# Patient Record
Sex: Male | Born: 1945
Health system: Southern US, Community
[De-identification: ages and names within clinical notes are randomized; demographics above are authoritative.]

## PROBLEM LIST (undated history)

## (undated) DIAGNOSIS — Z8719 Personal history of other diseases of the digestive system: Secondary | ICD-10-CM

## (undated) DIAGNOSIS — H832X1 Labyrinthine dysfunction, right ear: Secondary | ICD-10-CM

## (undated) DIAGNOSIS — S62173A Displaced fracture of trapezium [larger multangular], unspecified wrist, initial encounter for closed fracture: Secondary | ICD-10-CM

## (undated) DIAGNOSIS — Z8709 Personal history of other diseases of the respiratory system: Secondary | ICD-10-CM

## (undated) DIAGNOSIS — M799 Soft tissue disorder, unspecified: Secondary | ICD-10-CM

## (undated) DIAGNOSIS — Z86018 Personal history of other benign neoplasm: Secondary | ICD-10-CM

## (undated) DIAGNOSIS — Z87820 Personal history of traumatic brain injury: Secondary | ICD-10-CM

## (undated) DIAGNOSIS — H905 Unspecified sensorineural hearing loss: Secondary | ICD-10-CM

## (undated) DIAGNOSIS — J3489 Other specified disorders of nose and nasal sinuses: Secondary | ICD-10-CM

## (undated) DIAGNOSIS — H532 Diplopia: Secondary | ICD-10-CM

## (undated) DIAGNOSIS — S0450XA Injury of facial nerve, unspecified side, initial encounter: Secondary | ICD-10-CM

## (undated) DIAGNOSIS — E785 Hyperlipidemia, unspecified: Secondary | ICD-10-CM

## (undated) DIAGNOSIS — M5136 Other intervertebral disc degeneration, lumbar region: Secondary | ICD-10-CM

## (undated) DIAGNOSIS — Z87442 Personal history of urinary calculi: Secondary | ICD-10-CM

## (undated) DIAGNOSIS — R1310 Dysphagia, unspecified: Secondary | ICD-10-CM

## (undated) DIAGNOSIS — N486 Induration penis plastica: Secondary | ICD-10-CM

## (undated) DIAGNOSIS — M51369 Other intervertebral disc degeneration, lumbar region without mention of lumbar back pain or lower extremity pain: Secondary | ICD-10-CM

## (undated) DIAGNOSIS — K219 Gastro-esophageal reflux disease without esophagitis: Secondary | ICD-10-CM

## (undated) DIAGNOSIS — H8191 Unspecified disorder of vestibular function, right ear: Secondary | ICD-10-CM

## (undated) DIAGNOSIS — Z85828 Personal history of other malignant neoplasm of skin: Secondary | ICD-10-CM

## (undated) DIAGNOSIS — R972 Elevated prostate specific antigen [PSA]: Secondary | ICD-10-CM

## (undated) HISTORY — DX: Displaced fracture of trapezium (larger multangular), unspecified wrist, initial encounter for closed fracture: S62.173A

## (undated) HISTORY — DX: Elevated prostate specific antigen (PSA): R97.20

## (undated) HISTORY — DX: Induration penis plastica: N48.6

## (undated) HISTORY — DX: Dysphagia, unspecified: R13.10

## (undated) HISTORY — PX: ACOUSTIC NEUROMA RESECTION: SHX5713

## (undated) HISTORY — DX: Hyperlipidemia, unspecified: E78.5

---
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
## 1973-08-04 HISTORY — PX: NASAL SEPTUM SURGERY: SHX37

## 1979-07-05 HISTORY — PX: NASAL SINUS SURGERY: SHX719

## 1982-07-04 HISTORY — PX: OTHER SURGICAL HISTORY: SHX169

## 1982-07-04 HISTORY — PX: VEIN LIGATION AND STRIPPING: SHX2653

## 1992-03-24 DIAGNOSIS — D333 Benign neoplasm of cranial nerves: Secondary | ICD-10-CM | POA: Insufficient documentation

## 1992-03-25 DIAGNOSIS — H532 Diplopia: Secondary | ICD-10-CM

## 1992-03-25 HISTORY — DX: Diplopia: H53.2

## 1992-03-25 HISTORY — PX: CRANIECTOMY FOR EXCISION OF ACOUSTIC NEUROMA: SUR324

## 1999-07-05 DIAGNOSIS — N486 Induration penis plastica: Secondary | ICD-10-CM

## 1999-07-05 HISTORY — PX: OTHER SURGICAL HISTORY: SHX169

## 1999-07-05 HISTORY — DX: Induration penis plastica: N48.6

## 2006-02-23 ENCOUNTER — Ambulatory Visit: Payer: Self-pay | Admitting: Gastroenterology

## 2006-04-18 ENCOUNTER — Encounter: Admission: RE | Admit: 2006-04-18 | Discharge: 2006-07-17 | Payer: Self-pay | Admitting: Unknown Physician Specialty

## 2007-01-23 ENCOUNTER — Ambulatory Visit: Payer: Self-pay | Admitting: Urology

## 2007-02-27 ENCOUNTER — Ambulatory Visit: Payer: Self-pay | Admitting: Urology

## 2007-04-22 ENCOUNTER — Ambulatory Visit: Payer: Self-pay | Admitting: Internal Medicine

## 2007-04-30 ENCOUNTER — Ambulatory Visit: Payer: Self-pay | Admitting: Urology

## 2007-05-09 ENCOUNTER — Ambulatory Visit: Payer: Self-pay | Admitting: Urology

## 2008-03-12 ENCOUNTER — Ambulatory Visit: Payer: Self-pay | Admitting: Internal Medicine

## 2008-07-04 HISTORY — PX: INCONTINENCE SURGERY: SHX676

## 2010-07-03 ENCOUNTER — Ambulatory Visit: Payer: Self-pay | Admitting: Internal Medicine

## 2011-07-05 HISTORY — PX: COLONOSCOPY WITH PROPOFOL: SHX5780

## 2011-07-19 DIAGNOSIS — R972 Elevated prostate specific antigen [PSA]: Secondary | ICD-10-CM | POA: Diagnosis not present

## 2011-11-15 DIAGNOSIS — I10 Essential (primary) hypertension: Secondary | ICD-10-CM | POA: Diagnosis not present

## 2012-04-03 DIAGNOSIS — H16219 Exposure keratoconjunctivitis, unspecified eye: Secondary | ICD-10-CM | POA: Diagnosis not present

## 2012-04-24 DIAGNOSIS — R109 Unspecified abdominal pain: Secondary | ICD-10-CM | POA: Diagnosis not present

## 2012-05-24 DIAGNOSIS — K5730 Diverticulosis of large intestine without perforation or abscess without bleeding: Secondary | ICD-10-CM | POA: Diagnosis not present

## 2012-05-24 DIAGNOSIS — K2970 Gastritis, unspecified, without bleeding: Secondary | ICD-10-CM | POA: Diagnosis not present

## 2012-05-24 DIAGNOSIS — D126 Benign neoplasm of colon, unspecified: Secondary | ICD-10-CM | POA: Diagnosis not present

## 2012-05-24 DIAGNOSIS — K2990 Gastroduodenitis, unspecified, without bleeding: Secondary | ICD-10-CM | POA: Diagnosis not present

## 2012-05-24 DIAGNOSIS — R1013 Epigastric pain: Secondary | ICD-10-CM | POA: Diagnosis not present

## 2012-05-24 DIAGNOSIS — Z1211 Encounter for screening for malignant neoplasm of colon: Secondary | ICD-10-CM | POA: Diagnosis not present

## 2012-05-24 DIAGNOSIS — K3189 Other diseases of stomach and duodenum: Secondary | ICD-10-CM | POA: Diagnosis not present

## 2012-06-19 DIAGNOSIS — K589 Irritable bowel syndrome without diarrhea: Secondary | ICD-10-CM | POA: Diagnosis not present

## 2012-08-21 DIAGNOSIS — K589 Irritable bowel syndrome without diarrhea: Secondary | ICD-10-CM | POA: Diagnosis not present

## 2012-09-20 ENCOUNTER — Ambulatory Visit: Payer: Self-pay

## 2012-09-20 DIAGNOSIS — J329 Chronic sinusitis, unspecified: Secondary | ICD-10-CM | POA: Diagnosis not present

## 2012-10-03 DIAGNOSIS — N529 Male erectile dysfunction, unspecified: Secondary | ICD-10-CM | POA: Diagnosis not present

## 2012-10-03 DIAGNOSIS — R972 Elevated prostate specific antigen [PSA]: Secondary | ICD-10-CM | POA: Diagnosis not present

## 2012-11-15 DIAGNOSIS — R972 Elevated prostate specific antigen [PSA]: Secondary | ICD-10-CM | POA: Diagnosis not present

## 2013-03-07 DIAGNOSIS — N529 Male erectile dysfunction, unspecified: Secondary | ICD-10-CM | POA: Diagnosis not present

## 2013-03-07 DIAGNOSIS — R361 Hematospermia: Secondary | ICD-10-CM | POA: Diagnosis not present

## 2013-03-07 DIAGNOSIS — N401 Enlarged prostate with lower urinary tract symptoms: Secondary | ICD-10-CM | POA: Diagnosis not present

## 2013-03-07 DIAGNOSIS — N138 Other obstructive and reflux uropathy: Secondary | ICD-10-CM | POA: Diagnosis not present

## 2013-03-17 ENCOUNTER — Ambulatory Visit: Payer: Self-pay

## 2013-03-17 DIAGNOSIS — H9190 Unspecified hearing loss, unspecified ear: Secondary | ICD-10-CM | POA: Diagnosis not present

## 2013-03-17 DIAGNOSIS — R3919 Other difficulties with micturition: Secondary | ICD-10-CM | POA: Diagnosis not present

## 2013-03-17 DIAGNOSIS — N400 Enlarged prostate without lower urinary tract symptoms: Secondary | ICD-10-CM | POA: Diagnosis not present

## 2013-03-17 LAB — URINALYSIS, COMPLETE
Bacteria: NEGATIVE
Blood: NEGATIVE
Leukocyte Esterase: NEGATIVE
Nitrite: NEGATIVE
Ph: 6 (ref 4.5–8.0)
Specific Gravity: >=1.03 (ref 1.003–1.030)
Squamous Epithelial: NONE SEEN
WBC UR: NONE SEEN /HPF (ref 0–5)

## 2013-03-25 DIAGNOSIS — IMO0002 Reserved for concepts with insufficient information to code with codable children: Secondary | ICD-10-CM | POA: Diagnosis not present

## 2013-03-25 DIAGNOSIS — S838X9A Sprain of other specified parts of unspecified knee, initial encounter: Secondary | ICD-10-CM | POA: Diagnosis not present

## 2013-03-31 ENCOUNTER — Emergency Department: Payer: Self-pay | Admitting: Emergency Medicine

## 2013-03-31 DIAGNOSIS — R319 Hematuria, unspecified: Secondary | ICD-10-CM | POA: Diagnosis not present

## 2013-03-31 DIAGNOSIS — N419 Inflammatory disease of prostate, unspecified: Secondary | ICD-10-CM | POA: Diagnosis not present

## 2013-03-31 DIAGNOSIS — N410 Acute prostatitis: Secondary | ICD-10-CM | POA: Diagnosis not present

## 2013-03-31 DIAGNOSIS — R109 Unspecified abdominal pain: Secondary | ICD-10-CM | POA: Diagnosis not present

## 2013-03-31 DIAGNOSIS — Z79899 Other long term (current) drug therapy: Secondary | ICD-10-CM | POA: Diagnosis not present

## 2013-03-31 LAB — URINALYSIS, COMPLETE
Bilirubin,UR: NEGATIVE
Nitrite: NEGATIVE
Ph: 5 (ref 4.5–8.0)
Protein: 30
RBC,UR: 26 /HPF (ref 0–5)
Squamous Epithelial: NONE SEEN

## 2013-03-31 LAB — BASIC METABOLIC PANEL
BUN: 16 mg/dL (ref 7–18)
Calcium, Total: 8.8 mg/dL (ref 8.5–10.1)
Chloride: 108 mmol/L — ABNORMAL HIGH (ref 98–107)
Co2: 28 mmol/L (ref 21–32)
Creatinine: 0.99 mg/dL (ref 0.60–1.30)
EGFR (African American): >60
EGFR (Non-African Amer.): >60

## 2013-03-31 LAB — CBC
HCT: 44.7 % (ref 40.0–52.0)
HGB: 15.3 g/dL (ref 13.0–18.0)
MCH: 31.3 pg (ref 26.0–34.0)
MCV: 92 fL (ref 80–100)
RDW: 14.3 % (ref 11.5–14.5)
WBC: 4.4 10*3/uL (ref 3.8–10.6)

## 2013-04-04 DIAGNOSIS — M659 Synovitis and tenosynovitis, unspecified: Secondary | ICD-10-CM | POA: Diagnosis not present

## 2013-04-04 DIAGNOSIS — S83289A Other tear of lateral meniscus, current injury, unspecified knee, initial encounter: Secondary | ICD-10-CM | POA: Diagnosis not present

## 2013-04-04 DIAGNOSIS — IMO0002 Reserved for concepts with insufficient information to code with codable children: Secondary | ICD-10-CM | POA: Diagnosis not present

## 2013-04-04 DIAGNOSIS — M2240 Chondromalacia patellae, unspecified knee: Secondary | ICD-10-CM | POA: Diagnosis not present

## 2013-04-09 DIAGNOSIS — N400 Enlarged prostate without lower urinary tract symptoms: Secondary | ICD-10-CM | POA: Insufficient documentation

## 2013-04-09 DIAGNOSIS — N138 Other obstructive and reflux uropathy: Secondary | ICD-10-CM | POA: Diagnosis not present

## 2013-04-09 DIAGNOSIS — N401 Enlarged prostate with lower urinary tract symptoms: Secondary | ICD-10-CM | POA: Insufficient documentation

## 2013-04-09 DIAGNOSIS — R310 Gross hematuria: Secondary | ICD-10-CM | POA: Diagnosis not present

## 2013-04-10 DIAGNOSIS — Z23 Encounter for immunization: Secondary | ICD-10-CM | POA: Diagnosis not present

## 2013-04-15 DIAGNOSIS — N400 Enlarged prostate without lower urinary tract symptoms: Secondary | ICD-10-CM | POA: Diagnosis not present

## 2013-04-15 DIAGNOSIS — R972 Elevated prostate specific antigen [PSA]: Secondary | ICD-10-CM | POA: Diagnosis not present

## 2013-04-15 DIAGNOSIS — R310 Gross hematuria: Secondary | ICD-10-CM | POA: Diagnosis not present

## 2013-04-23 DIAGNOSIS — N400 Enlarged prostate without lower urinary tract symptoms: Secondary | ICD-10-CM | POA: Diagnosis not present

## 2013-04-23 DIAGNOSIS — N281 Cyst of kidney, acquired: Secondary | ICD-10-CM | POA: Diagnosis not present

## 2013-04-23 DIAGNOSIS — R310 Gross hematuria: Secondary | ICD-10-CM | POA: Diagnosis not present

## 2013-04-30 DIAGNOSIS — R310 Gross hematuria: Secondary | ICD-10-CM | POA: Diagnosis not present

## 2013-04-30 DIAGNOSIS — R972 Elevated prostate specific antigen [PSA]: Secondary | ICD-10-CM | POA: Diagnosis not present

## 2013-05-02 ENCOUNTER — Other Ambulatory Visit: Payer: Self-pay | Admitting: Urology

## 2013-05-04 HISTORY — PX: KNEE ARTHROSCOPY W/ MENISCAL REPAIR: SHX1877

## 2013-05-06 ENCOUNTER — Encounter (HOSPITAL_BASED_OUTPATIENT_CLINIC_OR_DEPARTMENT_OTHER): Payer: Self-pay | Admitting: *Deleted

## 2013-05-06 NOTE — Progress Notes (Signed)
NPO AFTER MN.  ARRIVE AT 0930.  NEEDS HG. 

## 2013-05-07 DIAGNOSIS — I10 Essential (primary) hypertension: Secondary | ICD-10-CM | POA: Diagnosis not present

## 2013-05-07 DIAGNOSIS — K439 Ventral hernia without obstruction or gangrene: Secondary | ICD-10-CM | POA: Diagnosis not present

## 2013-05-07 NOTE — H&P (Signed)
History of Present Illness  Gross hematuria: He was seen in the emergency room at [facility] on 03/31/13 for gross hematuria.  He had seen Dr. Lonna Cobb one week previously.  He was found to have a normal creatinine of 0.99,  normal serum calcium of 8.8 and a normal hemoglobin of 15.3.  His urinalysis had red cells but only 3 white blood cells and no bacteria.  He was placed on empiric Cipro however no culture was performed. A CT scan without contrast revealed some perinephric stranding bilaterally of questionable significance but no other abnormality.  He was noted to have prostatic enlargement but no bladder calculi.  BPH with outlet obstructive: He underwent KTP laser ablation of his prostate in 2008. He remains on alpha blockade therapy at this time.  Bladder calculus: He was treated with cystolitholapaxy in 2008 at which time he underwent outlet resistance surgery as well.  Elevated PSA: His PSA got as high as 6.02 in 9/12. 5/14 - 4.0 9/14 - 4.6/33%  Hypogonadism: He was found to have a low testosterone in the past. He was on hormone replacement therapy on time.  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  Peyronie's disease: He had dorsal curvature and underwent a plaque incision and patch graft by Dr. Colon Branch in the past.  Interval history:   Past Medical History Problems  1. History of  Bladder Calculus V13.01 2. History of  Esophageal Reflux 530.81 3. History of  Hearing Loss Right 389.9 4. 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 History of  Peyronie's Disease 607.85 6. History of  Reported A Previous Heart Murmur 7. 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 History of  Colonoscopy (Fiberoptic) 2. History of  Craniotomy Excision Of Acoustic Neuroma Right 3. History of  Cystoscopy With Fragmentation Of Bladder Calculus 4. History of  Endoscopy - Papillotomy 5. History of  Laser Coagulation Of  Prostate 6. History of  Maxillary Sinus Endoscopy (Therapeutic) 7. History of  Nasal Septal Deviation Repair 8. History of  Surgery Penis Excision Of Penile Plaque  W/ Graft To 5 Cm 9. History of  Tonsillectomy 10. History of  Varicose Vein Ligation  Current Meds 1. Aspirin 81 MG Oral Tablet; Therapy: (Recorded:13Oct2014) to 2. Centrum Silver TABS; Therapy: (Recorded:13Oct2014) to 3. Fish Oil CAPS; Therapy: (Recorded:13Oct2014) to 4. Osteo Bi-Flex Joint Shield TABS; Therapy: (Recorded:13Oct2014) to 5. Vitamin D3 1000 UNIT Oral Capsule; Therapy: (Recorded:13Oct2014) to  Allergies Medication  1. Cephalexin CAPS 2. Cephalosporins  Family History Problems  1. Family history of  Death In The Family Father 2. Family history of  Death In The Family Mother 3. Family history of  Family Health Status Children ___ Living Daughters 1 4. Maternal history of  Stroke Syndrome V17.1  Social History Problems  1. Marital History - Currently Married 2. Never A Smoker 3. Retired From Work Denied  4. History of  Alcohol Use 5. History of  Caffeine Use  Review of Systems Genitourinary, constitutional, skin, eye, otolaryngeal, hematologic/lymphatic, cardiovascular, pulmonary, endocrine, musculoskeletal, gastrointestinal, neurological and psychiatric system(s) were reviewed and pertinent findings if present are noted.  Genitourinary: nocturia, weak urinary stream, hematuria and erectile dysfunction.  Gastrointestinal: heartburn.  Eyes: diplopia.  ENT: sinus problems.  Hematologic/Lymphatic: a tendency to easily bruise and swollen glands.  Cardiovascular: leg swelling.  Respiratory: shortness of breath.  Endocrine: polydipsia.  Musculoskeletal: back pain and joint pain.  Neurological: dizziness.  Vitals Vital Signs  BMI Calculated: 27.62 BSA Calculated: 1.91 Height: 5 ft 7 in Weight: 176 lb  Blood Pressure: 131 / 77 Temperature: 97.9 F Heart Rate: 64  Physical  Exam Constitutional: Well nourished and well developed . No acute distress.  ENT:. The ears and nose are normal in appearance.  Neck: The appearance of the neck is normal and no neck mass is present.  Pulmonary: No respiratory distress and normal respiratory rhythm and effort.  Cardiovascular: Heart rate and rhythm are normal . No peripheral edema.  Abdomen: The abdomen is soft and nontender. No masses are palpated. No CVA tenderness. No hernias are palpable. No hepatosplenomegaly noted.  Rectal: Rectal exam demonstrates normal sphincter tone, no tenderness and no masses. The prostate has no nodularity and is not tender. The left seminal vesicle is nonpalpable. The right seminal vesicle is nonpalpable. The perineum is normal on inspection.  Genitourinary: Examination of the penis demonstrates no discharge, no masses, no lesions and a normal meatus. The scrotum is without lesions. The right epididymis is palpably normal and non-tender. The left epididymis is palpably normal and non-tender. The right testis is non-tender and without masses. The left testis is non-tender and without masses.  Lymphatics: The femoral and inguinal nodes are not enlarged or tender.  Skin: Normal skin turgor, no visible rash and no visible skin lesions.  Neuro/Psych:. Mood and affect are appropriate.   AU CT-HEMATURIA PROTOCOL  Ihor Gully   Test Name Result Flag Reference  AU CT-HEMATURIA PROTOCOL (Report)    ** RADIOLOGY REPORT BY [facility] **   CLINICAL DATA: Gross hematuria  EXAM: CT ABDOMEN AND PELVIS WITHOUT AND WITH CONTRAST  TECHNIQUE: Multidetector CT imaging of the abdomen and pelvis was performed without contrast material in one or both body regions, followed by contrast material(s) and further sections in one or both body regions.  CONTRAST: 125 mL Isovue 300 IV  COMPARISON: None.  FINDINGS: Mild dependent atelectasis at the lung bases.  Liver, pancreas, and adrenal glands  are within normal limits.  Mildly heterogeneous spleen with possible small hypoenhancing lesions measuring up to 8 mm (series 3/image 31), although this normalizes on delayed imaging and may simply reflect heterogeneous early perfusion. Regardless, this appearance is likely benign.  Gallbladder is unremarkable. No intrahepatic or extrahepatic ductal dilatation.  Small bilateral renal cysts, including an 8 mm medial right upper pole renal cyst (series 3/image 33) and a 10 mm medial left lower pole renal cyst (series 3/image 46). No enhancing renal lesions. Small bilateral renal sinus cysts. No renal calculi or hydronephrosis.  No evidence of bowel obstruction. Normal appendix. Colonic diverticulosis, without associated inflammatory changes.  No evidence of abdominal aortic aneurysm.  No abdominopelvic ascites.  No suspicious abdominopelvic lymphadenopathy.  Prostatomegaly with enlargement of the central gland which indents the base of the bladder.  No ureteral or bladder calculi.  Thick-walled bladder, possibly reflecting chronic bladder outlet obstruction.  On delayed imaging, there are no filling defects in the bilateral opacified proximal collecting systems, ureters, or bladder.  Tiny fat containing right inguinal hernia.  Mild degenerative changes of the visualized lower thoracic spine.  IMPRESSION: No renal, ureteral, or bladder calculi. No hydronephrosis.  Bilateral renal cysts measuring up to 10 mm in the left lower kidney. No enhancing renal lesions.  No filling defects in the bilateral opacified proximal collecting systems, ureters, or bladder.  Prostatomegaly with enlargement of the central gland which indents the base of the bladder.  Thick-walled bladder, possibly reflecting  chronic bladder outlet obstruction. Cystoscopic correlation is suggested.   Electronically Signed  By: Charline Bills M.D.  On: 04/23/2013 12:23   BUN & CREATININE   Ihor Gully  SPECIMEN TYPE: BLOOD   Test Name Result Flag Reference  CREATININE 0.96 mg/dL  4.09-8.11  BUN 19 mg/dL  9-14  Est GFR, African American >89 mL/min    Est GFR, NonAfrican American 80 mL/min    THE ESTIMATED GFR IS A CALCULATION VALID FOR ADULTS (>=48 YEARS OLD) THAT USES THE CKD-EPI ALGORITHM TO ADJUST FOR AGE AND SEX. IT IS   NOT TO BE USED FOR CHILDREN, PREGNANT WOMEN, HOSPITALIZED PATIENTS,    PATIENTS ON DIALYSIS, OR WITH RAPIDLY CHANGING KIDNEY FUNCTION. ACCORDING TO THE NKDEP, EGFR >89 IS NORMAL, 60-89 SHOWS MILD IMPAIRMENT, 30-59 SHOWS MODERATE IMPAIRMENT, 15-29 SHOWS SEVERE IMPAIRMENT AND <15 IS ESRD.   PSA REFLEX TO FREE  Ihor Gully  SPECIMEN TYPE: BLOOD   Test Name Result Flag Reference  PSA 5.95 ng/mL H <=4.00  TEST METHODOLOGY: ECLIA PSA (ELECTROCHEMILUMINESCENCE IMMUNOASSAY)  PSA, FREE 1.55 ng/mL    PSA, %FREE 26 %  > 25  PROBABILITY OF PROSTATE CANCER   (FOR MEN WITH NON-SUSPICIOUS DRE RESULTS AND PSA BETWEEN 4 AND   10 NG/ML, BY PATIENT AGE)     % FREE PSA                          PATIENT AGE                          50 TO 59 YEARS  60 TO 69 YEARS  >70 YEARS    <=10%                  49.2%           57.5%          64.5%    11 - 18%               26.9%           33.9%          40.8%    19 - 25%               18.3%           23.9%          29.7%    >25%                    9.1%           12.2%          15.8%   Procedure  Procedure: Cystoscopy   Indication: Hematuria.  Informed Consent: Risks, benefits, and potential adverse events were discussed and informed consent was obtained from the patient.  Prep: The patient was prepped with betadine.  Anesthesia:. Local anesthesia was administered intraurethrally with 2% lidocaine jelly.  Procedure Note:  Urethral meatus:. No abnormalities.  Anterior urethra: No abnormalities.  Prostatic urethra: No abnormalities.  Bladder: Visulization was clear. The ureteral orifices were in the normal anatomic  position bilaterally and had clear efflux of urine. Examination of the bladder demonstrated mild trabeculation. There was an area inside the bladder neck at the 11 o'clock position that appeared to possibly be some form of inflammation/edema however transitional cell carcinoma could not be ruled out. The patient tolerated the procedure well.  Complications: None.    Assessment  We went over the results of his workup for hematuria which has revealed a normal creatinine of 0.96.  We discussed his CT scan findings which have revealed no abnormality of the kidneys, ureters or bladder.  He did have simple cysts of the kidneys of no clinical significance and I discussed that with him today.  Cystoscopically I found changes in the prostatic urethra consistent with his previous KTP laser surgery however the apical tissue still appeared to be somewhat obstructing.  I did find an area in the bladder that appeared somewhat papillary although it could be some form of edema are not sure why it would be there at that location and I therefore have recommended further evaluation with a biopsy of this lesion.  I went over the procedure with him in detail including its risks and complications, the probability of success and the outpatient nature of the procedure as well as anticipated postoperative course.  He understands and elected to proceed.      His PSA was found to be elevated at 5.95 last 26%.  While elevated his free to total ratio remains good indicating that this is likely due to benign causes however we discussed biopsy versus observation since it has been higher in the past.  I have currently recommended a repeat DRE and PSA in 6 months.   Plan  He will be scheduled for transurethral resection of the bladder lesion under anesthesia as an outpatient.

## 2013-05-09 ENCOUNTER — Encounter: Payer: Self-pay | Admitting: General Surgery

## 2013-05-10 ENCOUNTER — Encounter (HOSPITAL_BASED_OUTPATIENT_CLINIC_OR_DEPARTMENT_OTHER): Payer: Self-pay | Admitting: Anesthesiology

## 2013-05-10 ENCOUNTER — Encounter (HOSPITAL_BASED_OUTPATIENT_CLINIC_OR_DEPARTMENT_OTHER)
Admission: RE | Disposition: A | Discharge status: Home / Self Care | Payer: Self-pay | Source: Ambulatory Visit | Attending: Urology

## 2013-05-10 ENCOUNTER — Ambulatory Visit (HOSPITAL_BASED_OUTPATIENT_CLINIC_OR_DEPARTMENT_OTHER): Payer: Medicare Other | Admitting: Anesthesiology

## 2013-05-10 ENCOUNTER — Encounter (HOSPITAL_BASED_OUTPATIENT_CLINIC_OR_DEPARTMENT_OTHER): Payer: Medicare Other | Admitting: Anesthesiology

## 2013-05-10 ENCOUNTER — Ambulatory Visit (HOSPITAL_BASED_OUTPATIENT_CLINIC_OR_DEPARTMENT_OTHER)
Admission: RE | Admit: 2013-05-10 | Discharge: 2013-05-10 | Disposition: A | Discharge status: Home / Self Care | Payer: Medicare Other | Source: Ambulatory Visit | Attending: Urology | Admitting: Urology

## 2013-05-10 DIAGNOSIS — N329 Bladder disorder, unspecified: Secondary | ICD-10-CM

## 2013-05-10 DIAGNOSIS — Z7982 Long term (current) use of aspirin: Secondary | ICD-10-CM | POA: Insufficient documentation

## 2013-05-10 DIAGNOSIS — N320 Bladder-neck obstruction: Secondary | ICD-10-CM | POA: Insufficient documentation

## 2013-05-10 DIAGNOSIS — N138 Other obstructive and reflux uropathy: Secondary | ICD-10-CM | POA: Insufficient documentation

## 2013-05-10 DIAGNOSIS — R972 Elevated prostate specific antigen [PSA]: Secondary | ICD-10-CM | POA: Diagnosis not present

## 2013-05-10 DIAGNOSIS — N401 Enlarged prostate with lower urinary tract symptoms: Secondary | ICD-10-CM | POA: Diagnosis not present

## 2013-05-10 DIAGNOSIS — N3080 Other cystitis without hematuria: Secondary | ICD-10-CM | POA: Diagnosis not present

## 2013-05-10 DIAGNOSIS — K219 Gastro-esophageal reflux disease without esophagitis: Secondary | ICD-10-CM | POA: Insufficient documentation

## 2013-05-10 HISTORY — DX: Personal history of traumatic brain injury: Z87.820

## 2013-05-10 HISTORY — DX: Other intervertebral disc degeneration, lumbar region: M51.36

## 2013-05-10 HISTORY — DX: Unspecified disorder of vestibular function, right ear: H81.91

## 2013-05-10 HISTORY — DX: Personal history of other diseases of the respiratory system: Z87.09

## 2013-05-10 HISTORY — DX: Gastro-esophageal reflux disease without esophagitis: K21.9

## 2013-05-10 HISTORY — DX: Diplopia: H53.2

## 2013-05-10 HISTORY — DX: Unspecified sensorineural hearing loss: H90.5

## 2013-05-10 HISTORY — DX: Other intervertebral disc degeneration, lumbar region without mention of lumbar back pain or lower extremity pain: M51.369

## 2013-05-10 HISTORY — PX: CYSTOSCOPY WITH BIOPSY: SHX5122

## 2013-05-10 HISTORY — DX: Personal history of other diseases of the digestive system: Z87.19

## 2013-05-10 HISTORY — DX: Labyrinthine dysfunction, right ear: H83.2X1

## 2013-05-10 LAB — POCT HEMOGLOBIN-HEMACUE: Hemoglobin: 14.5 g/dL (ref 13.0–17.0)

## 2013-05-10 SURGERY — CYSTOSCOPY, WITH BIOPSY
Anesthesia: General | Site: Bladder | Wound class: Clean Contaminated

## 2013-05-10 MED ORDER — PROPOFOL 10 MG/ML IV BOLUS
INTRAVENOUS | Status: DC | PRN
  Administered 2013-05-10: 150 mg via INTRAVENOUS

## 2013-05-10 MED ORDER — ONDANSETRON HCL 4 MG/2ML IJ SOLN
INTRAMUSCULAR | Status: DC | PRN
  Administered 2013-05-10: 4 mg via INTRAVENOUS

## 2013-05-10 MED ORDER — LIDOCAINE HCL (CARDIAC) 20 MG/ML IV SOLN
INTRAVENOUS | Status: DC | PRN
  Administered 2013-05-10: 60 mg via INTRAVENOUS

## 2013-05-10 MED ORDER — PHENAZOPYRIDINE HCL 200 MG PO TABS
200.0000 mg | ORAL_TABLET | Freq: Once | ORAL | Status: AC
  Administered 2013-05-10: 200 mg via ORAL
  Filled 2013-05-10: qty 1

## 2013-05-10 MED ORDER — FENTANYL CITRATE 0.05 MG/ML IJ SOLN
INTRAMUSCULAR | Status: DC | PRN
  Administered 2013-05-10: 25 ug via INTRAVENOUS
  Administered 2013-05-10: 50 ug via INTRAVENOUS
  Administered 2013-05-10: 25 ug via INTRAVENOUS

## 2013-05-10 MED ORDER — CIPROFLOXACIN IN D5W 200 MG/100ML IV SOLN
200.0000 mg | INTRAVENOUS | Status: AC
  Administered 2013-05-10: 200 mg via INTRAVENOUS
  Filled 2013-05-10: qty 100

## 2013-05-10 MED ORDER — FENTANYL CITRATE 0.05 MG/ML IJ SOLN
25.0000 ug | INTRAMUSCULAR | Status: DC | PRN
  Filled 2013-05-10: qty 1

## 2013-05-10 MED ORDER — EPHEDRINE SULFATE 50 MG/ML IJ SOLN
INTRAMUSCULAR | Status: DC | PRN
  Administered 2013-05-10: 10 mg via INTRAVENOUS

## 2013-05-10 MED ORDER — PROMETHAZINE HCL 25 MG/ML IJ SOLN
6.2500 mg | INTRAMUSCULAR | Status: DC | PRN
  Filled 2013-05-10: qty 1

## 2013-05-10 MED ORDER — LACTATED RINGERS IV SOLN
INTRAVENOUS | Status: DC
  Administered 2013-05-10 (×2): via INTRAVENOUS
  Filled 2013-05-10: qty 1000

## 2013-05-10 MED ORDER — PHENAZOPYRIDINE HCL 200 MG PO TABS: 200.0000 mg | ORAL_TABLET | Freq: Three times a day (TID) | ORAL | Status: DC | PRN

## 2013-05-10 MED ORDER — HYDROCODONE-ACETAMINOPHEN 7.5-325 MG PO TABS: 1.0000 | ORAL_TABLET | ORAL | Status: DC | PRN

## 2013-05-10 MED ORDER — SODIUM CHLORIDE 0.9 % IR SOLN
Status: DC | PRN
  Administered 2013-05-10: 6000 mL

## 2013-05-10 SURGICAL SUPPLY — 15 items
BAG DRAIN URO-CYSTO SKYTR STRL (DRAIN) ×2 IMPLANT
CANISTER SUCT LVC 12 LTR MEDI- (MISCELLANEOUS) ×2 IMPLANT
CLOTH BEACON ORANGE TIMEOUT ST (SAFETY) ×2 IMPLANT
DRAPE CAMERA CLOSED 9X96 (DRAPES) ×2 IMPLANT
ELECT LOOP MED HF 24F 12D CBL (CLIP) ×2 IMPLANT
ELECT REM PT RETURN 9FT ADLT (ELECTROSURGICAL) ×2
ELECTRODE REM PT RTRN 9FT ADLT (ELECTROSURGICAL) ×1 IMPLANT
GLOVE BIO SURGEON STRL SZ8 (GLOVE) ×2 IMPLANT
GOWN PREVENTION PLUS LG XLONG (DISPOSABLE) ×2 IMPLANT
GOWN STRL REIN XL XLG (GOWN DISPOSABLE) ×2 IMPLANT
IV NS IRRIG 3000ML ARTHROMATIC (IV SOLUTION) ×4 IMPLANT
NEEDLE HYPO 22GX1.5 SAFETY (NEEDLE) IMPLANT
NS IRRIG 500ML POUR BTL (IV SOLUTION) IMPLANT
PACK CYSTOSCOPY (CUSTOM PROCEDURE TRAY) ×2 IMPLANT
WATER STERILE IRR 3000ML UROMA (IV SOLUTION) ×2 IMPLANT

## 2013-05-10 NOTE — Op Note (Signed)
PATIENT:  Jeffery Vasquez  PRE-OPERATIVE DIAGNOSIS: Suspicious bladder lesion  POST-OPERATIVE DIAGNOSIS: Same  PROCEDURE: Bladder biopsy  SURGEON:  Garnett Farm  INDICATION: Jeffery Vasquez is a 67 year old male who previously underwent KTP laser ablation of his prostate in 2008. He continues to require alpha blockade therapy for outlet obstruction. He recently experienced gross hematuria and evaluation of his upper tract was normal by CT scan. Cystoscopically I found what appeared to be either inflammation or possibly transitional cell carcinoma the bladder located just inside the bladder neck at the 11:00 position. We discussed the need for further evaluation with cystoscopy and biopsy.  ANESTHESIA:  General  EBL:  Minimal  DRAINS: None  LOCAL MEDICATIONS USED:  None  SPECIMEN:  Biopsies from the 6:00 and 11:00 positions of the bladder neck to pathology  Description of procedure: After informed consent the patient was taken to the major or, placed on the table and administered general anesthesia. He was then moved to the dorsal lithotomy position and his genitalia sterilely prepped and draped. An official timeout was then performed.  Rigid cystoscopy was performed first using a 20 French cystoscope and 12 lens urethra was noted be normal down to the sphincter which is intact. The prostatic urethra revealed bilobar hypertrophy. No prostatic lesions were identified. Upon entering the bladder I noted one plus trabeculation. The ureteral orifices were well away from the bladder neck. There was again the area of question at the 11:00 position inside the bladder neck and then also a similar but less concerning area at the 6:00 position at the bladder neck as well.  I removed the cystoscope and inserted the resectoscope sheath with Timberlake obturator and removed the obturator. The 12 lens with resectoscope element was then inserted and I obtained a biopsy of the lesion at the 6:00 position  which, when cut into, appeared to be prostatic tissue. The area at 11:00 was also resected and it appeared as if it may have been inflammatory as well. The area was fulgurated, the bladder was drained and the resectoscope was removed. The patient tolerated procedure well and there were no intraoperative complications.  PLAN OF CARE: Discharge to home after PACU  PATIENT DISPOSITION:  PACU - hemodynamically stable.

## 2013-05-10 NOTE — Anesthesia Procedure Notes (Signed)
Procedure Name: LMA Insertion Date/Time: 05/10/2013 11:05 AM Performed by: Norva Pavlov Pre-anesthesia Checklist: Patient identified, Emergency Drugs available, Suction available and Patient being monitored Patient Re-evaluated:Patient Re-evaluated prior to inductionOxygen Delivery Method: Circle System Utilized Preoxygenation: Pre-oxygenation with 100% oxygen Intubation Type: IV induction Ventilation: Mask ventilation without difficulty LMA: LMA inserted LMA Size: 4.0 Number of attempts: 1 Airway Equipment and Method: bite block Placement Confirmation: positive ETCO2 Tube secured with: Tape Dental Injury: Teeth and Oropharynx as per pre-operative assessment

## 2013-05-10 NOTE — Transfer of Care (Signed)
Immediate Anesthesia Transfer of Care Note  Patient: Jeffery Vasquez  Procedure(s) Performed: Procedure(s) (LRB): CYSTOSCOPY WITH BLADDER BIOPSY (N/A)  Patient Location: PACU  Anesthesia Type: General  Level of Consciousness: awake, alert  and oriented  Airway & Oxygen Therapy: Patient Spontanous Breathing and Patient connected to face mask oxygen  Post-op Assessment: Report given to PACU RN and Post -op Vital signs reviewed and stable  Post vital signs: Reviewed and stable  Complications: No apparent anesthesia complications

## 2013-05-10 NOTE — Anesthesia Preprocedure Evaluation (Addendum)
Anesthesia Evaluation  Patient identified by MRN, date of birth, ID band Patient awake    Reviewed: Allergy & Precautions, H&P , NPO status , Patient's Chart, lab work & pertinent test results  Airway Mallampati: II TM Distance: >3 FB Neck ROM: Full    Dental no notable dental hx.    Pulmonary neg pulmonary ROS,  breath sounds clear to auscultation  Pulmonary exam normal       Cardiovascular negative cardio ROS  Rhythm:Regular Rate:Normal     Neuro/Psych H/O neurofibroma right acoustic nerve negative psych ROS   GI/Hepatic Neg liver ROS, GERD-  ,  Endo/Other  negative endocrine ROS  Renal/GU negative Renal ROS  negative genitourinary   Musculoskeletal negative musculoskeletal ROS (+)   Abdominal   Peds negative pediatric ROS (+)  Hematology negative hematology ROS (+)   Anesthesia Other Findings   Reproductive/Obstetrics negative OB ROS                          Anesthesia Physical Anesthesia Plan  ASA: II  Anesthesia Plan: General   Post-op Pain Management:    Induction: Intravenous  Airway Management Planned: LMA  Additional Equipment:   Intra-op Plan:   Post-operative Plan: Extubation in OR  Informed Consent: I have reviewed the patients History and Physical, chart, labs and discussed the procedure including the risks, benefits and alternatives for the proposed anesthesia with the patient or authorized representative who has indicated his/her understanding and acceptance.   Dental advisory given  Plan Discussed with: CRNA  Anesthesia Plan Comments:         Anesthesia Quick Evaluation

## 2013-05-10 NOTE — Anesthesia Postprocedure Evaluation (Signed)
  Anesthesia Post-op Note  Patient: Jeffery Vasquez  Procedure(s) Performed: Procedure(s) (LRB): CYSTOSCOPY WITH BLADDER BIOPSY (N/A)  Patient Location: PACU  Anesthesia Type: General  Level of Consciousness: awake and alert   Airway and Oxygen Therapy: Patient Spontanous Breathing  Post-op Pain: mild  Post-op Assessment: Post-op Vital signs reviewed, Patient's Cardiovascular Status Stable, Respiratory Function Stable, Patent Airway and No signs of Nausea or vomiting  Last Vitals:  Filed Vitals:   05/10/13 1307  BP: 143/72  Pulse: 59  Temp: 35.8 C  Resp: 16    Post-op Vital Signs: stable   Complications: No apparent anesthesia complications

## 2013-05-10 NOTE — Interval H&P Note (Signed)
History and Physical Interval Note:  05/10/2013 10:53 AM  Heywood Bene  has presented today for surgery, with the diagnosis of BLADDER LESION  The various methods of treatment have been discussed with the patient and family. After consideration of risks, benefits and other options for treatment, the patient has consented to  Procedure(s): CYSTOSCOPY WITH BLADDER BIOPSY (N/A) as a surgical intervention .  The patient's history has been reviewed, patient examined, no change in status, stable for surgery.  I have reviewed the patient's chart and labs.  Questions were answered to the patient's satisfaction.     Garnett Farm

## 2013-05-13 ENCOUNTER — Encounter (HOSPITAL_BASED_OUTPATIENT_CLINIC_OR_DEPARTMENT_OTHER): Payer: Self-pay | Admitting: Urology

## 2013-05-16 ENCOUNTER — Emergency Department (HOSPITAL_COMMUNITY)
Admission: EM | Admit: 2013-05-16 | Discharge: 2013-05-16 | Disposition: A | Discharge status: Home / Self Care | Payer: Medicare Other | Attending: Emergency Medicine | Admitting: Emergency Medicine

## 2013-05-16 ENCOUNTER — Encounter (HOSPITAL_COMMUNITY): Payer: Self-pay | Admitting: Emergency Medicine

## 2013-05-16 DIAGNOSIS — Z87820 Personal history of traumatic brain injury: Secondary | ICD-10-CM | POA: Insufficient documentation

## 2013-05-16 DIAGNOSIS — K429 Umbilical hernia without obstruction or gangrene: Secondary | ICD-10-CM | POA: Insufficient documentation

## 2013-05-16 DIAGNOSIS — Z87448 Personal history of other diseases of urinary system: Secondary | ICD-10-CM | POA: Insufficient documentation

## 2013-05-16 DIAGNOSIS — Z87828 Personal history of other (healed) physical injury and trauma: Secondary | ICD-10-CM | POA: Insufficient documentation

## 2013-05-16 DIAGNOSIS — Z8709 Personal history of other diseases of the respiratory system: Secondary | ICD-10-CM | POA: Insufficient documentation

## 2013-05-16 DIAGNOSIS — Z8739 Personal history of other diseases of the musculoskeletal system and connective tissue: Secondary | ICD-10-CM | POA: Insufficient documentation

## 2013-05-16 DIAGNOSIS — Z8669 Personal history of other diseases of the nervous system and sense organs: Secondary | ICD-10-CM | POA: Diagnosis not present
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
## 2013-05-16 NOTE — ED Notes (Signed)
Pt reports hernia x2 weeks, pain intermittent. States pain severe today, described as "cramping". Hernia palpated above umbilicus. Pt has been referred to specialist but they are on vacation this week.

## 2013-05-17 ENCOUNTER — Encounter (INDEPENDENT_AMBULATORY_CARE_PROVIDER_SITE_OTHER): Payer: Self-pay | Admitting: Surgery

## 2013-05-17 ENCOUNTER — Ambulatory Visit (INDEPENDENT_AMBULATORY_CARE_PROVIDER_SITE_OTHER): Payer: Medicare Other | Admitting: Surgery

## 2013-05-17 VITALS — BP 128/70 | HR 60 | Resp 16 | Ht 67.0 in | Wt 172.4 lb

## 2013-05-17 DIAGNOSIS — K219 Gastro-esophageal reflux disease without esophagitis: Secondary | ICD-10-CM | POA: Insufficient documentation

## 2013-05-17 DIAGNOSIS — Z87898 Personal history of other specified conditions: Secondary | ICD-10-CM

## 2013-05-17 DIAGNOSIS — K439 Ventral hernia without obstruction or gangrene: Secondary | ICD-10-CM | POA: Diagnosis not present

## 2013-05-17 DIAGNOSIS — Z86018 Personal history of other benign neoplasm: Secondary | ICD-10-CM | POA: Insufficient documentation
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
## 2013-05-17 NOTE — Patient Instructions (Signed)
Ventral Hernia A ventral hernia (also called an incisional hernia) is a hernia that occurs at the site of a previous surgical cut (incision) in the abdomen. The abdominal wall spans from your lower chest down to your pelvis. If the abdominal wall is weakened from a surgical incision, a hernia can occur. A hernia is a bulge of bowel or muscle tissue pushing out on the weakened part of the abdominal wall. Ventral hernias can get bigger from straining or lifting. Obese and older people are at higher risk for a ventral hernia. People who develop infections after surgery or require repeat incisions at the same site on the abdomen are also at increased risk. CAUSES  A ventral hernia occurs because of weakness in the abdominal wall at an incision site.  SYMPTOMS  Common symptoms include:  A visible bulge or lump on the abdominal wall.  Pain or tenderness around the lump.  Increased discomfort if you cough or make a sudden movement. If the hernia has blocked part of the intestine, a serious complication can occur (incarcerated or strangulated hernia). This can become a problem that requires emergency surgery because the blood flow to the blocked intestine may be cut off. Symptoms may include:  Feeling sick to your stomach (nauseous).  Throwing up (vomiting).  Stomach swelling (distention) or bloating.  Fever.  Rapid heartbeat. DIAGNOSIS  Your caregiver will take a medical history and perform a physical exam. Various tests may be ordered, such as:  Blood tests.  Urine tests.  Ultrasonography.  X-rays.  Computed tomography (CT). TREATMENT  Watchful waiting may be all that is needed for a smaller hernia that does not cause symptoms. Your caregiver may recommend the use of a supportive belt (truss) that helps to keep the abdominal wall intact. For larger hernias or those that cause pain, surgery to repair the hernia is usually recommended. If a hernia becomes strangulated, emergency surgery  needs to be done right away. HOME CARE INSTRUCTIONS  Avoid putting pressure or strain on the abdominal area.  Avoid heavy lifting.  Use good body positioning for physical tasks. Ask your caregiver about proper body positioning.  Use a supportive belt as directed by your caregiver.  Maintain a healthy weight.  Eat foods that are high in fiber, such as whole grains, fruits, and vegetables. Fiber helps prevent difficult bowel movements (constipation).  Drink enough fluids to keep your urine clear or pale yellow.  Follow up with your caregiver as directed. SEEK MEDICAL CARE IF:   Your hernia seems to be getting larger or more painful. SEEK IMMEDIATE MEDICAL CARE IF:   You have abdominal pain that is sudden and sharp.  Your pain becomes severe.  You have repeated vomiting.  You are sweating a lot.  You notice a rapid heartbeat.  You develop a fever. MAKE SURE YOU:   Understand these instructions.  Will watch your condition.  Will get help right away if you are not doing well or get worse. Document Released: 06/06/2012 Document Reviewed: 06/06/2012 [facility] Patient Information 2014 [place], [place]. Diet for Gastroesophageal Reflux Disease, Adult Reflux (acid reflux) is when acid from your stomach flows up into the esophagus. When acid comes in contact with the esophagus, the acid causes irritation and soreness (inflammation) in the esophagus. When reflux happens often or so severely that it causes damage to the esophagus, it is called gastroesophageal reflux disease (GERD). Nutrition therapy can help ease the discomfort of GERD. FOODS OR DRINKS TO AVOID OR LIMIT  Smoking or  chewing tobacco. Nicotine is one of the most potent stimulants to acid production in the gastrointestinal tract.  Caffeinated and decaffeinated coffee and black tea.  Regular or low-calorie carbonated beverages or energy drinks (caffeine-free carbonated beverages are allowed).   Strong spices, such  as black pepper, white pepper, red pepper, cayenne, curry powder, and chili powder.  Peppermint or spearmint.  Chocolate.  High-fat foods, including meats and fried foods. Extra added fats including oils, butter, salad dressings, and nuts. Limit these to less than 8 tsp per day.  Fruits and vegetables if they are not tolerated, such as citrus fruits or tomatoes.  Alcohol.  Any food that seems to aggravate your condition. If you have questions regarding your diet, call your caregiver or a registered dietitian. OTHER THINGS THAT MAY HELP GERD INCLUDE:   Eating your meals slowly, in a relaxed setting.  Eating 5 to 6 small meals per day instead of 3 large meals.  Eliminating food for a period of time if it causes distress.  Not lying down until 3 hours after eating a meal.  Keeping the head of your bed raised 6 to 9 inches (15 to 23 cm) by using a foam wedge or blocks under the legs of the bed. Lying flat may make symptoms worse.  Being physically active. Weight loss may be helpful in reducing reflux in overweight or obese adults.  Wear loose fitting clothing EXAMPLE MEAL PLAN This meal plan is approximately 2,000 calories based on https://www.bernard.org/ meal planning guidelines. Breakfast   cup cooked oatmeal.  1 cup strawberries.  1 cup low-fat milk.  1 oz almonds. Snack  1 cup cucumber slices.  6 oz yogurt (made from low-fat or fat-free milk). Lunch  2 slice whole-wheat bread.  2 oz sliced [place].  2 tsp mayonnaise.  1 cup blueberries.  1 cup snap peas. Snack  6 whole-wheat crackers.  1 oz string cheese. Dinner   cup brown rice.  1 cup mixed veggies.  1 tsp olive oil.  3 oz grilled fish. Document Released: 06/20/2005 Document Revised: 09/12/2011 Document Reviewed: 05/06/2011 [facility] Patient Information 2014 [place], [place].

## 2013-05-20 ENCOUNTER — Telehealth (INDEPENDENT_AMBULATORY_CARE_PROVIDER_SITE_OTHER): Payer: Self-pay | Admitting: *Deleted

## 2013-05-20 DIAGNOSIS — R310 Gross hematuria: Secondary | ICD-10-CM | POA: Diagnosis not present

## 2013-05-20 DIAGNOSIS — N3080 Other cystitis without hematuria: Secondary | ICD-10-CM | POA: Diagnosis not present

## 2013-05-20 NOTE — Telephone Encounter (Signed)
LMOM for pt to return my call.  I was calling to inform him of his appt for the UGI at [facility] on 11/19 with an arrival time of 8:45am.  Pt is to be NPO after midnight.

## 2013-05-22 ENCOUNTER — Ambulatory Visit
Admission: RE | Admit: 2013-05-22 | Discharge: 2013-05-22 | Disposition: A | Discharge status: Home / Self Care | Payer: Federal, State, Local not specified - PPO | Source: Ambulatory Visit | Attending: Surgery | Admitting: Surgery

## 2013-05-22 DIAGNOSIS — K219 Gastro-esophageal reflux disease without esophagitis: Secondary | ICD-10-CM

## 2013-05-23 ENCOUNTER — Ambulatory Visit: Payer: Self-pay | Admitting: General Surgery

## 2013-05-29 ENCOUNTER — Ambulatory Visit: Payer: Self-pay | Admitting: General Surgery

## 2013-06-05 DIAGNOSIS — R1084 Generalized abdominal pain: Secondary | ICD-10-CM | POA: Diagnosis not present

## 2013-06-05 DIAGNOSIS — R319 Hematuria, unspecified: Secondary | ICD-10-CM | POA: Diagnosis not present

## 2013-06-05 DIAGNOSIS — K458 Other specified abdominal hernia without obstruction or gangrene: Secondary | ICD-10-CM | POA: Diagnosis not present

## 2013-06-05 DIAGNOSIS — Z125 Encounter for screening for malignant neoplasm of prostate: Secondary | ICD-10-CM | POA: Diagnosis not present

## 2013-06-12 ENCOUNTER — Encounter (INDEPENDENT_AMBULATORY_CARE_PROVIDER_SITE_OTHER): Payer: Self-pay | Admitting: Surgery

## 2013-06-12 ENCOUNTER — Ambulatory Visit (INDEPENDENT_AMBULATORY_CARE_PROVIDER_SITE_OTHER): Payer: Medicare Other | Admitting: Surgery

## 2013-06-12 VITALS — BP 124/62 | HR 74 | Resp 18 | Ht 67.0 in | Wt 168.0 lb

## 2013-06-12 DIAGNOSIS — K439 Ventral hernia without obstruction or gangrene: Secondary | ICD-10-CM | POA: Diagnosis not present

## 2013-06-12 NOTE — Patient Instructions (Signed)
Laparoscopic Ventral Hernia Repair Laparoscopic ventral hernia repairis a surgery to fix a ventral hernia. Aventral hernia, also called an incisional hernia, is a bulge of body tissue or intestines that pushes through the front part of the abdomen. This can happen if the connective tissue covering the muscles over the abdomen has a weak spot or is torn because of a surgical cut (incision) from a previous surgery. Laparoscopic ventral hernia repair is often done soon after diagnosis to stop the hernia from getting bigger, becoming uncomfortable, or becoming an emergency. This surgery usually takes about 2 hours, but the time can vary greatly. LET YOUR CAREGIVER KNOW ABOUT:  Any allergies you have.  All medicines you are taking, including steroids, vitamins, herbs, eyedrops, and over-the-counter medicines and creams.  Previous problems you or members of your family have had with the use of anesthetics.  Any blood disorders or bleeding problems you have had.  Past surgeries you have had.  Other health problems you have. RISKS AND COMPLICATIONS  Generally, laparoscopic ventral hernia repair is a safe procedure. However, as with any surgical procedure, complications can occur. Possible complications include:  Bleeding.  Trouble passing urine or having a bowel movement after the operation.  Infection.  Pneumonia.  Blood clots.  Pain in the area of the hernia.  A bulge in the area of the hernia that may be caused by a collection of fluid.  Injury to intestines or other structures in the abdomen.  Return of the hernia after surgery. In some cases, the caregiver may need to stop the laparoscopic procedure and do regular, open surgery. This may be necessary for very difficult hernias, when organs are hard to see, or when bleeding problems occur during surgery. BEFORE THE PROCEDURE   You may need to have blood tests, urine tests, a chest X-ray, or electrocardiography done before the day  of the surgery.  Ask your caregiver about changing or stopping your regular medicines.  You may need to wash with a special type of germ-killing soap.  Do not eat or drink anything for at least 6 hours before the surgery.  Make plans to have someone drive you home after the procedure. PROCEDURE   Small monitors will be put on your body. They are used to check your heart, blood pressure, and oxygen level.  An intravenous (IV) access tube will be put into a vein in your hand or arm. Fluids and medicine will flow directly into your body through the IV tube.  You will be given medicine to make you sleep through the procedure (general anesthetic).  Your abdomen will be cleaned with a special soap to kill any germs on your skin.  Once you are asleep, several small incisions will be made in your abdomen.  The large space in your abdomen will be filled with air so that it expands. This gives the caregiver more room and a better view.  A thin, lighted tube with a tiny camera on the end (laparoscope) is put through a small incision in your abdomen. The camera on the laparoscope sends a picture to a TV screen in the operating room. This gives the caregiver a good view inside the abdomen.  Hollow tubes are put through the other small incisions in your abdomen. The tools needed for the procedure are put through these tubes.  The caregiver puts the tissue or intestines that formed the hernia back in place.  A screen-like patch (mesh) is used to close the hernia. This helps make   the area stronger. Stitches, tacks, or staples are used to keep the mesh in place.  Medicine and a bandage (dressing) or skin glue will be put over the incisions. AFTER THE PROCEDURE   You will stay in a recovery area until the anesthetic wears off. Your blood pressure and pulse will be checked often.  You may be able to go home the same day or may need to stay in the hospital for 1 or 2 days after this surgery. Your  caregiver will decide when you can go home.  You may feel some pain. You will likely be given medicine for pain.  You will be urged to do breathing exercises that involve taking deep breaths. This helps prevent a lung infection after a surgery.  You may have to wear compression stockings while you are in the hospital. These stockings help keep blood clots from forming in your legs. Document Released: 06/06/2012 Document Reviewed: 06/06/2012 [employer] Patient Information 2014 [employer].  
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
## 2013-06-13 ENCOUNTER — Encounter (HOSPITAL_COMMUNITY): Payer: Self-pay | Admitting: Emergency Medicine

## 2013-06-13 ENCOUNTER — Emergency Department (HOSPITAL_COMMUNITY)
Admission: EM | Admit: 2013-06-13 | Discharge: 2013-06-14 | Disposition: A | Discharge status: Home / Self Care | Payer: Medicare Other | Attending: Emergency Medicine | Admitting: Emergency Medicine

## 2013-06-13 ENCOUNTER — Emergency Department (HOSPITAL_COMMUNITY): Payer: Medicare Other

## 2013-06-13 DIAGNOSIS — Z8669 Personal history of other diseases of the nervous system and sense organs: Secondary | ICD-10-CM | POA: Insufficient documentation

## 2013-06-13 DIAGNOSIS — K5732 Diverticulitis of large intestine without perforation or abscess without bleeding: Secondary | ICD-10-CM | POA: Insufficient documentation

## 2013-06-13 DIAGNOSIS — Z8739 Personal history of other diseases of the musculoskeletal system and connective tissue: Secondary | ICD-10-CM | POA: Diagnosis not present

## 2013-06-13 DIAGNOSIS — Z8709 Personal history of other diseases of the respiratory system: Secondary | ICD-10-CM | POA: Diagnosis not present

## 2013-06-13 DIAGNOSIS — Z87828 Personal history of other (healed) physical injury and trauma: Secondary | ICD-10-CM | POA: Insufficient documentation

## 2013-06-13 DIAGNOSIS — N400 Enlarged prostate without lower urinary tract symptoms: Secondary | ICD-10-CM | POA: Diagnosis not present

## 2013-06-13 DIAGNOSIS — K5792 Diverticulitis of intestine, part unspecified, without perforation or abscess without bleeding: Secondary | ICD-10-CM

## 2013-06-13 DIAGNOSIS — R319 Hematuria, unspecified: Secondary | ICD-10-CM | POA: Diagnosis not present

## 2013-06-13 DIAGNOSIS — N390 Urinary tract infection, site not specified: Secondary | ICD-10-CM | POA: Insufficient documentation

## 2013-06-13 DIAGNOSIS — K429 Umbilical hernia without obstruction or gangrene: Secondary | ICD-10-CM | POA: Diagnosis not present

## 2013-06-13 LAB — COMPREHENSIVE METABOLIC PANEL
Alkaline Phosphatase: 56 U/L (ref 39–117)
BUN: 20 mg/dL (ref 6–23)
CO2: 26 mEq/L (ref 19–32)
Calcium: 8.7 mg/dL (ref 8.4–10.5)
GFR calc Af Amer: >90 mL/min (ref >90)
GFR calc non Af Amer: 84 mL/min — ABNORMAL LOW (ref >90)
Glucose, Bld: 122 mg/dL — ABNORMAL HIGH (ref 70–99)
Potassium: 4 mEq/L (ref 3.5–5.1)
Total Protein: 5.9 g/dL — ABNORMAL LOW (ref 6.0–8.3)

## 2013-06-13 LAB — CBC WITH DIFFERENTIAL/PLATELET
Eosinophils Absolute: 0.1 10*3/uL (ref 0.0–0.7)
Eosinophils Relative: 2 % (ref 0–5)
HCT: 36.3 % — ABNORMAL LOW (ref 39.0–52.0)
Hemoglobin: 12.2 g/dL — ABNORMAL LOW (ref 13.0–17.0)
Lymphocytes Relative: 32 % (ref 12–46)
Lymphs Abs: 1.7 10*3/uL (ref 0.7–4.0)
MCH: 31.1 pg (ref 26.0–34.0)
MCV: 92.6 fL (ref 78.0–100.0)
Monocytes Absolute: 0.5 10*3/uL (ref 0.1–1.0)
Monocytes Relative: 9 % (ref 3–12)
RBC: 3.92 MIL/uL — ABNORMAL LOW (ref 4.22–5.81)
WBC: 5.4 10*3/uL (ref 4.0–10.5)

## 2013-06-13 LAB — URINALYSIS, ROUTINE W REFLEX MICROSCOPIC
Glucose, UA: NEGATIVE mg/dL
Protein, ur: 100 mg/dL — AB
Specific Gravity, Urine: 1.035 — ABNORMAL HIGH (ref 1.005–1.030)
pH: 5.5 (ref 5.0–8.0)

## 2013-06-13 LAB — URINE MICROSCOPIC-ADD ON

## 2013-06-13 MED ORDER — ONDANSETRON HCL 4 MG/2ML IJ SOLN
4.0000 mg | Freq: Once | INTRAMUSCULAR | Status: AC
  Administered 2013-06-13: 4 mg via INTRAVENOUS
  Filled 2013-06-13: qty 2

## 2013-06-13 MED ORDER — CIPROFLOXACIN HCL 500 MG PO TABS: 500.0000 mg | ORAL_TABLET | Freq: Two times a day (BID) | ORAL | Status: DC

## 2013-06-13 MED ORDER — METRONIDAZOLE 500 MG PO TABS: 500.0000 mg | ORAL_TABLET | Freq: Three times a day (TID) | ORAL | Status: DC

## 2013-06-13 MED ORDER — CIPROFLOXACIN IN D5W 400 MG/200ML IV SOLN
400.0000 mg | Freq: Once | INTRAVENOUS | Status: AC
  Administered 2013-06-13: 400 mg via INTRAVENOUS
  Filled 2013-06-13 (×2): qty 200

## 2013-06-13 MED ORDER — IOHEXOL 300 MG/ML  SOLN
100.0000 mL | Freq: Once | INTRAMUSCULAR | Status: AC | PRN
  Administered 2013-06-13: 100 mL via INTRAVENOUS

## 2013-06-13 MED ORDER — IOHEXOL 300 MG/ML  SOLN
50.0000 mL | Freq: Once | INTRAMUSCULAR | Status: AC | PRN
  Administered 2013-06-13: 50 mL via ORAL

## 2013-06-13 MED ORDER — SODIUM CHLORIDE 0.9 % IV SOLN
Freq: Once | INTRAVENOUS | Status: AC
  Administered 2013-06-13: 23:00:00 via INTRAVENOUS

## 2013-06-13 MED ORDER — MORPHINE SULFATE 4 MG/ML IJ SOLN
4.0000 mg | Freq: Once | INTRAMUSCULAR | Status: AC
  Administered 2013-06-13: 4 mg via INTRAVENOUS
  Filled 2013-06-13: qty 1

## 2013-06-13 NOTE — ED Notes (Signed)
Pt is c/o left flank pain and states he has blood in his urine  Pt states the pain started 2 days ago and has progressively gotten worse  Pt states he also has a hernia Dr Daphine Deutscher in going to do surgery on him later this month  Pt states he had blood in his urine back in Sept and they did a scan but did not show a kidney stone at that time
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
## 2013-06-17 DIAGNOSIS — K5730 Diverticulosis of large intestine without perforation or abscess without bleeding: Secondary | ICD-10-CM | POA: Diagnosis not present

## 2013-06-17 NOTE — ED Provider Notes (Signed)
Medical screening examination/treatment/procedure(s) were conducted as a shared visit with non-physician practitioner(s) and myself.  I personally evaluated the patient during the encounter.  EKG Interpretation   None       Patient's pain controlled, CT shows early mild diverticulitis. Patient with no systemic signs, feel he can start with po outpatient abx and strict return precautions.  Audree Camel, MD 06/17/13 [phone]

## 2013-06-19 ENCOUNTER — Encounter (HOSPITAL_COMMUNITY): Payer: Self-pay | Admitting: Pharmacy Technician

## 2013-06-20 DIAGNOSIS — L820 Inflamed seborrheic keratosis: Secondary | ICD-10-CM | POA: Diagnosis not present

## 2013-06-20 DIAGNOSIS — Z1283 Encounter for screening for malignant neoplasm of skin: Secondary | ICD-10-CM | POA: Diagnosis not present

## 2013-06-20 DIAGNOSIS — D485 Neoplasm of uncertain behavior of skin: Secondary | ICD-10-CM | POA: Diagnosis not present

## 2013-06-20 DIAGNOSIS — D1739 Benign lipomatous neoplasm of skin and subcutaneous tissue of other sites: Secondary | ICD-10-CM | POA: Diagnosis not present

## 2013-06-20 NOTE — Patient Instructions (Addendum)
Castulo Scarpelli  06/20/2013                           YOUR PROCEDURE IS SCHEDULED ON: 06/24/13               PLEASE REPORT TO SHORT STAY CENTER AT : 10:45 AM               CALL THIS NUMBER IF ANY PROBLEMS THE DAY OF SURGERY :               [phone]                      REMEMBER:   Do not eat food or drink liquids AFTER MIDNIGHT  May have clear liquids UNTIL 6 HOURS BEFORE SURGERY (7:15 AM)  Clear liquids include soda, tea, black coffee, apple or grape juice, broth.  Take these medicines the morning of surgery with A SIP OF WATER:  NONE   Do not wear jewelry, make-up   Do not wear lotions, powders, or perfumes.   Do not shave legs or underarms 12 hrs. before surgery (men may shave face)  Do not bring valuables to the hospital.  Contacts, dentures or bridgework may not be worn into surgery.  Leave suitcase in the car. After surgery it may be brought to your room.  For patients admitted to the hospital more than one night, checkout time is 11:00                          The day of discharge.   Patients discharged the day of surgery will not be allowed to drive home                             If going home same day of surgery, must have someone stay with you first                           24 hrs at home and arrange for some one to drive you home from hospital.    Special Instructions:   Please read over the following fact sheets that you were given:               1. STOP ASPIRIN AND HERBAL MEDS 5 DAYS PREOP                      2. [place] PREPARING FOR SURGERY SHEET               3. USE FLEET ENEMA THE NIGHT BEFORE SURGERY                                                X_____________________________________________________________________        Failure to follow these instructions may result in cancellation of your surgery

## 2013-06-21 ENCOUNTER — Encounter (HOSPITAL_COMMUNITY)
Admission: RE | Admit: 2013-06-21 | Discharge: 2013-06-21 | Disposition: A | Discharge status: Home / Self Care | Payer: Medicare Other | Source: Ambulatory Visit | Attending: Surgery | Admitting: Surgery

## 2013-06-21 ENCOUNTER — Encounter (HOSPITAL_COMMUNITY): Payer: Self-pay

## 2013-06-21 DIAGNOSIS — K449 Diaphragmatic hernia without obstruction or gangrene: Secondary | ICD-10-CM | POA: Diagnosis not present

## 2013-06-21 DIAGNOSIS — R319 Hematuria, unspecified: Secondary | ICD-10-CM | POA: Diagnosis not present

## 2013-06-21 DIAGNOSIS — K219 Gastro-esophageal reflux disease without esophagitis: Secondary | ICD-10-CM | POA: Diagnosis not present

## 2013-06-21 DIAGNOSIS — K439 Ventral hernia without obstruction or gangrene: Secondary | ICD-10-CM | POA: Diagnosis not present

## 2013-06-21 HISTORY — DX: Soft tissue disorder, unspecified: M79.9

## 2013-06-21 HISTORY — DX: Other specified disorders of nose and nasal sinuses: J34.89

## 2013-06-21 HISTORY — DX: Injury of facial nerve, unspecified side, initial encounter: S04.50XA

## 2013-06-21 HISTORY — DX: Personal history of urinary calculi: Z87.442

## 2013-06-21 LAB — CBC
Hemoglobin: 13.5 g/dL (ref 13.0–17.0)
MCH: 30.8 pg (ref 26.0–34.0)
MCHC: 33.2 g/dL (ref 30.0–36.0)
MCV: 92.7 fL (ref 78.0–100.0)
Platelets: 201 10*3/uL (ref 150–400)
RBC: 4.39 MIL/uL (ref 4.22–5.81)

## 2013-06-24 ENCOUNTER — Encounter (HOSPITAL_COMMUNITY)
Admission: RE | Disposition: A | Discharge status: Home / Self Care | Payer: Self-pay | Source: Ambulatory Visit | Attending: Surgery

## 2013-06-24 ENCOUNTER — Encounter (HOSPITAL_COMMUNITY): Payer: Self-pay | Admitting: *Deleted

## 2013-06-24 ENCOUNTER — Encounter (HOSPITAL_COMMUNITY): Payer: Medicare Other | Admitting: Registered Nurse

## 2013-06-24 ENCOUNTER — Ambulatory Visit (HOSPITAL_COMMUNITY): Payer: Medicare Other | Admitting: Registered Nurse

## 2013-06-24 ENCOUNTER — Observation Stay (HOSPITAL_COMMUNITY)
Admission: RE | Admit: 2013-06-24 | Discharge: 2013-06-25 | Disposition: A | Discharge status: Home / Self Care | Payer: Medicare Other | Source: Ambulatory Visit | Attending: Surgery | Admitting: Surgery

## 2013-06-24 DIAGNOSIS — Z8719 Personal history of other diseases of the digestive system: Secondary | ICD-10-CM

## 2013-06-24 DIAGNOSIS — K449 Diaphragmatic hernia without obstruction or gangrene: Secondary | ICD-10-CM | POA: Insufficient documentation

## 2013-06-24 DIAGNOSIS — K439 Ventral hernia without obstruction or gangrene: Principal | ICD-10-CM | POA: Diagnosis present

## 2013-06-24 DIAGNOSIS — K219 Gastro-esophageal reflux disease without esophagitis: Secondary | ICD-10-CM | POA: Insufficient documentation

## 2013-06-24 DIAGNOSIS — R319 Hematuria, unspecified: Secondary | ICD-10-CM | POA: Diagnosis not present

## 2013-06-24 HISTORY — PX: INSERTION OF MESH: SHX5868

## 2013-06-24 HISTORY — PX: VENTRAL HERNIA REPAIR: SHX424

## 2013-06-24 LAB — CBC
HCT: 40.8 % (ref 39.0–52.0)
MCHC: 31.6 g/dL (ref 30.0–36.0)
MCV: 94.2 fL (ref 78.0–100.0)
RDW: 13.7 % (ref 11.5–15.5)
WBC: 6.4 10*3/uL (ref 4.0–10.5)

## 2013-06-24 LAB — CREATININE, SERUM: GFR calc Af Amer: >90 mL/min (ref >90)

## 2013-06-24 SURGERY — REPAIR, HERNIA, VENTRAL, LAPAROSCOPIC
Anesthesia: General | Site: Abdomen

## 2013-06-24 MED ORDER — HEPARIN SODIUM (PORCINE) 5000 UNIT/ML IJ SOLN
5000.0000 [IU] | Freq: Three times a day (TID) | INTRAMUSCULAR | Status: DC
  Administered 2013-06-24 – 2013-06-25 (×2): 5000 [IU] via SUBCUTANEOUS
  Filled 2013-06-24 (×5): qty 1

## 2013-06-24 MED ORDER — PROPOFOL 10 MG/ML IV BOLUS
INTRAVENOUS | Status: AC
  Filled 2013-06-24: qty 20

## 2013-06-24 MED ORDER — ROCURONIUM BROMIDE 100 MG/10ML IV SOLN
INTRAVENOUS | Status: DC | PRN
  Administered 2013-06-24: 50 mg via INTRAVENOUS

## 2013-06-24 MED ORDER — ONDANSETRON HCL 4 MG/2ML IJ SOLN: 4.0000 mg | Freq: Four times a day (QID) | INTRAMUSCULAR | Status: DC | PRN

## 2013-06-24 MED ORDER — GLYCOPYRROLATE 0.2 MG/ML IJ SOLN
INTRAMUSCULAR | Status: DC | PRN
  Administered 2013-06-24: .6 mg via INTRAVENOUS

## 2013-06-24 MED ORDER — FENTANYL CITRATE 0.05 MG/ML IJ SOLN
INTRAMUSCULAR | Status: DC | PRN
  Administered 2013-06-24 (×4): 50 ug via INTRAVENOUS

## 2013-06-24 MED ORDER — CIPROFLOXACIN IN D5W 400 MG/200ML IV SOLN
INTRAVENOUS | Status: AC
  Filled 2013-06-24: qty 200

## 2013-06-24 MED ORDER — FENTANYL CITRATE 0.05 MG/ML IJ SOLN
INTRAMUSCULAR | Status: AC
  Filled 2013-06-24: qty 5

## 2013-06-24 MED ORDER — DEXAMETHASONE SODIUM PHOSPHATE 10 MG/ML IJ SOLN
INTRAMUSCULAR | Status: AC
  Filled 2013-06-24: qty 1

## 2013-06-24 MED ORDER — LACTATED RINGERS IV SOLN: INTRAVENOUS | Status: DC

## 2013-06-24 MED ORDER — HYDROMORPHONE HCL PF 1 MG/ML IJ SOLN
INTRAMUSCULAR | Status: AC
  Filled 2013-06-24: qty 1

## 2013-06-24 MED ORDER — PROPOFOL 10 MG/ML IV BOLUS
INTRAVENOUS | Status: DC | PRN
  Administered 2013-06-24: 180 mg via INTRAVENOUS

## 2013-06-24 MED ORDER — MIDAZOLAM HCL 2 MG/2ML IJ SOLN
INTRAMUSCULAR | Status: AC
  Filled 2013-06-24: qty 2

## 2013-06-24 MED ORDER — CHLORHEXIDINE GLUCONATE 4 % EX LIQD: 1.0000 "application " | Freq: Once | CUTANEOUS | Status: DC

## 2013-06-24 MED ORDER — ONDANSETRON HCL 4 MG PO TABS: 4.0000 mg | ORAL_TABLET | Freq: Four times a day (QID) | ORAL | Status: DC | PRN

## 2013-06-24 MED ORDER — HYDROMORPHONE HCL PF 1 MG/ML IJ SOLN
0.2500 mg | INTRAMUSCULAR | Status: DC | PRN
  Administered 2013-06-24 (×4): 0.5 mg via INTRAVENOUS

## 2013-06-24 MED ORDER — KCL IN DEXTROSE-NACL 20-5-0.45 MEQ/L-%-% IV SOLN
INTRAVENOUS | Status: DC
  Administered 2013-06-24: 18:00:00 via INTRAVENOUS
  Filled 2013-06-24 (×3): qty 1000

## 2013-06-24 MED ORDER — ONDANSETRON HCL 4 MG/2ML IJ SOLN
INTRAMUSCULAR | Status: DC | PRN
  Administered 2013-06-24: 4 mg via INTRAVENOUS

## 2013-06-24 MED ORDER — NEOSTIGMINE METHYLSULFATE 1 MG/ML IJ SOLN
INTRAMUSCULAR | Status: DC | PRN
  Administered 2013-06-24: 4 mg via INTRAVENOUS

## 2013-06-24 MED ORDER — BACITRACIN-NEOMYCIN-POLYMYXIN 400-5-5000 EX OINT
TOPICAL_OINTMENT | CUTANEOUS | Status: AC
  Filled 2013-06-24: qty 1

## 2013-06-24 MED ORDER — BACITRACIN-NEOMYCIN-POLYMYXIN OINTMENT TUBE
TOPICAL_OINTMENT | CUTANEOUS | Status: DC | PRN
  Administered 2013-06-24: 1 via TOPICAL

## 2013-06-24 MED ORDER — CHLORHEXIDINE GLUCONATE 4 % EX LIQD
1.0000 | Freq: Once | CUTANEOUS | Status: DC
Start: 2013-06-25 — End: 2013-06-24

## 2013-06-24 MED ORDER — 0.9 % SODIUM CHLORIDE (POUR BTL) OPTIME
TOPICAL | Status: DC | PRN
  Administered 2013-06-24: 1000 mL

## 2013-06-24 MED ORDER — LIDOCAINE HCL (CARDIAC) 20 MG/ML IV SOLN
INTRAVENOUS | Status: DC | PRN
  Administered 2013-06-24: 80 mg via INTRAVENOUS

## 2013-06-24 MED ORDER — GLYCOPYRROLATE 0.2 MG/ML IJ SOLN
INTRAMUSCULAR | Status: AC
  Filled 2013-06-24: qty 3

## 2013-06-24 MED ORDER — ROCURONIUM BROMIDE 100 MG/10ML IV SOLN
INTRAVENOUS | Status: AC
  Filled 2013-06-24: qty 1

## 2013-06-24 MED ORDER — EPHEDRINE SULFATE 50 MG/ML IJ SOLN
INTRAMUSCULAR | Status: DC | PRN
  Administered 2013-06-24: 5 mg via INTRAVENOUS

## 2013-06-24 MED ORDER — CIPROFLOXACIN IN D5W 400 MG/200ML IV SOLN
400.0000 mg | INTRAVENOUS | Status: AC
  Administered 2013-06-24: 400 mg via INTRAVENOUS

## 2013-06-24 MED ORDER — ONDANSETRON HCL 4 MG/2ML IJ SOLN
INTRAMUSCULAR | Status: AC
  Filled 2013-06-24: qty 2

## 2013-06-24 MED ORDER — BUPIVACAINE-EPINEPHRINE 0.25% -1:200000 IJ SOLN
INTRAMUSCULAR | Status: DC | PRN
  Administered 2013-06-24: 20 mL

## 2013-06-24 MED ORDER — DEXAMETHASONE SODIUM PHOSPHATE 10 MG/ML IJ SOLN
INTRAMUSCULAR | Status: DC | PRN
  Administered 2013-06-24: 10 mg via INTRAVENOUS

## 2013-06-24 MED ORDER — MIDAZOLAM HCL 5 MG/5ML IJ SOLN
INTRAMUSCULAR | Status: DC | PRN
  Administered 2013-06-24: 2 mg via INTRAVENOUS

## 2013-06-24 MED ORDER — LACTATED RINGERS IV SOLN
INTRAVENOUS | Status: DC | PRN
  Administered 2013-06-24: 12:00:00 via INTRAVENOUS

## 2013-06-24 MED ORDER — BUPIVACAINE-EPINEPHRINE 0.25% -1:200000 IJ SOLN
INTRAMUSCULAR | Status: AC
  Filled 2013-06-24: qty 1

## 2013-06-24 MED ORDER — PROMETHAZINE HCL 25 MG/ML IJ SOLN: 6.2500 mg | INTRAMUSCULAR | Status: DC | PRN

## 2013-06-24 MED ORDER — LIDOCAINE HCL (CARDIAC) 20 MG/ML IV SOLN
INTRAVENOUS | Status: AC
Start: 2013-06-24 — End: 2013-06-24
  Filled 2013-06-24: qty 5

## 2013-06-24 MED ORDER — HEPARIN SODIUM (PORCINE) 5000 UNIT/ML IJ SOLN
5000.0000 [IU] | Freq: Once | INTRAMUSCULAR | Status: AC
  Administered 2013-06-24: 5000 [IU] via SUBCUTANEOUS
  Filled 2013-06-24: qty 1

## 2013-06-24 SURGICAL SUPPLY — 51 items
BENZOIN TINCTURE PRP APPL 2/3 (GAUZE/BANDAGES/DRESSINGS) IMPLANT
BINDER ABD UNIV 12 45-62 (WOUND CARE) ×2 IMPLANT
BINDER ABDOMINAL 46IN 62IN (WOUND CARE) ×3
CANISTER SUCTION 2500CC (MISCELLANEOUS) ×3 IMPLANT
COVER SURGICAL LIGHT HANDLE (MISCELLANEOUS) IMPLANT
DECANTER SPIKE VIAL GLASS SM (MISCELLANEOUS) ×3 IMPLANT
DERMABOND ADVANCED (GAUZE/BANDAGES/DRESSINGS) ×1
DERMABOND ADVANCED .7 DNX12 (GAUZE/BANDAGES/DRESSINGS) ×2 IMPLANT
DEVICE SECURE STRAP 25 ABSORB (INSTRUMENTS) ×3 IMPLANT
DEVICE TROCAR PUNCTURE CLOSURE (ENDOMECHANICALS) ×3 IMPLANT
DISSECTOR BLUNT TIP ENDO 5MM (MISCELLANEOUS) IMPLANT
DRAIN CHANNEL RND F F (WOUND CARE) IMPLANT
DRAPE LAPAROSCOPIC ABDOMINAL (DRAPES) ×3 IMPLANT
ELECT REM PT RETURN 9FT ADLT (ELECTROSURGICAL) ×3
ELECTRODE REM PT RTRN 9FT ADLT (ELECTROSURGICAL) ×2 IMPLANT
EVACUATOR SILICONE 100CC (DRAIN) IMPLANT
GLOVE BIOGEL M 8.0 STRL (GLOVE) ×3 IMPLANT
GLOVE BIOGEL PI IND STRL 7.0 (GLOVE) ×4 IMPLANT
GLOVE BIOGEL PI INDICATOR 7.0 (GLOVE) ×2
GOWN PREVENTION PLUS LG XLONG (DISPOSABLE) IMPLANT
GOWN STRL REIN XL XLG (GOWN DISPOSABLE) ×9 IMPLANT
IV LACTATED RINGER IRRG 3000ML (IV SOLUTION) ×1
IV LR IRRIG 3000ML ARTHROMATIC (IV SOLUTION) ×2 IMPLANT
KIT BASIN OR (CUSTOM PROCEDURE TRAY) ×3 IMPLANT
MARKER SKIN DUAL TIP RULER LAB (MISCELLANEOUS) ×3 IMPLANT
MESH VENTRALEX ST 2.5 CRC MED (Mesh General) ×3 IMPLANT
NEEDLE SPNL 22GX3.5 QUINCKE BK (NEEDLE) ×3 IMPLANT
NS IRRIG 1000ML POUR BTL (IV SOLUTION) ×3 IMPLANT
PENCIL BUTTON HOLSTER BLD 10FT (ELECTRODE) IMPLANT
SCALPEL HARMONIC ACE (MISCELLANEOUS) IMPLANT
SET IRRIG TUBING LAPAROSCOPIC (IRRIGATION / IRRIGATOR) IMPLANT
SLEEVE XCEL OPT CAN 5 100 (ENDOMECHANICALS) ×3 IMPLANT
SOLUTION ANTI FOG 6CC (MISCELLANEOUS) ×3 IMPLANT
STAPLER VISISTAT 35W (STAPLE) IMPLANT
STRIP CLOSURE SKIN 1/2X4 (GAUZE/BANDAGES/DRESSINGS) IMPLANT
SUT NOVA 0 T19/GS 22DT (SUTURE) IMPLANT
SUT NOVA 1 T20/GS 25DT (SUTURE) IMPLANT
SUT NOVA NAB DX-16 0-1 5-0 T12 (SUTURE) ×3 IMPLANT
SUT PROLENE 0 CT 1 CR/8 (SUTURE) IMPLANT
SUT VIC AB 2-0 SH 27 (SUTURE) ×1
SUT VIC AB 2-0 SH 27X BRD (SUTURE) ×2 IMPLANT
SUT VIC AB 4-0 SH 18 (SUTURE) ×3 IMPLANT
SYR 30ML LL (SYRINGE) ×3 IMPLANT
TACKER 5MM HERNIA 3.5CML NAB (ENDOMECHANICALS) ×3 IMPLANT
TRAY FOLEY CATH 14FRSI W/METER (CATHETERS) ×3 IMPLANT
TRAY LAP CHOLE (CUSTOM PROCEDURE TRAY) ×3 IMPLANT
TROCAR BLADELESS OPT 5 100 (ENDOMECHANICALS) ×6 IMPLANT
TROCAR XCEL NON-BLD 11X100MML (ENDOMECHANICALS) IMPLANT
TROCAR XCEL UNIV SLVE 11M 100M (ENDOMECHANICALS) IMPLANT
TUBING FILTER THERMOFLATOR (ELECTROSURGICAL) IMPLANT
TUBING INSUFFLATION 10FT LAP (TUBING) ×3 IMPLANT

## 2013-06-24 NOTE — Anesthesia Preprocedure Evaluation (Addendum)
Anesthesia Evaluation  Patient identified by MRN, date of birth, ID band Patient awake    Reviewed: Allergy & Precautions, H&P , NPO status , Patient's Chart, lab work & pertinent test results  Airway Mallampati: II TM Distance: >3 FB Neck ROM: Full    Dental no notable dental hx. (+) Caps and Dental Advisory Given   Pulmonary neg pulmonary ROS,  breath sounds clear to auscultation  Pulmonary exam normal       Cardiovascular negative cardio ROS  Rhythm:Regular Rate:Normal     Neuro/Psych H/O neurofibroma right acoustic nerve negative psych ROS   GI/Hepatic Neg liver ROS, GERD-  ,  Endo/Other  negative endocrine ROS  Renal/GU negative Renal ROS  negative genitourinary   Musculoskeletal negative musculoskeletal ROS (+)   Abdominal   Peds  Hematology negative hematology ROS (+)   Anesthesia Other Findings Right facial paralysis  Reproductive/Obstetrics                         Anesthesia Physical Anesthesia Plan  ASA: III  Anesthesia Plan: General   Post-op Pain Management:    Induction: Intravenous  Airway Management Planned: Oral ETT  Additional Equipment:   Intra-op Plan:   Post-operative Plan: Extubation in OR  Informed Consent: I have reviewed the patients History and Physical, chart, labs and discussed the procedure including the risks, benefits and alternatives for the proposed anesthesia with the patient or authorized representative who has indicated his/her understanding and acceptance.   Dental advisory given  Plan Discussed with: CRNA  Anesthesia Plan Comments:         Anesthesia Quick Evaluation

## 2013-06-24 NOTE — Op Note (Signed)
Surgeon: Wenda Low, MD, FACS  Asst:  none  Anes:  general  Procedure: Lap assisted ventral hernia repair with 6 cm Ventralex mesh  Diagnosis: Midline epigastric hernia  Complications: none  EBL:   minimal cc  Description of Procedure:  Taken to OR 1 and given general anesthesia.  Shaved and prepped.  Timeout.  Abdomen entered with 5 mm Optiview techniquie through the left upper quadrant.  Hernia visualized and had small ring about 1.5 cm in diameter.  Longitudinal incision and removal of sack.  Mesh deployed and secured but didn't seat well.  Further dissection and redeployement and tacking of mesh with horizontal mattress sutures 1 Novafil and closure of defect transversely.  Secure strap absorbable tacker used to secure mesh.  Using three 5 mm ports - two on the left and one on the right.  Mesh lying flat and secure.  Injected with Marcaine and closed with 4-0 vicryl and Dermabond.    Jeffery B. Daphine Deutscher, MD, [facility], [place] [phone]

## 2013-06-24 NOTE — Interval H&P Note (Signed)
History and Physical Interval Note:  06/24/2013 11:56 AM  Jeffery Vasquez  has presented today for surgery, with the diagnosis of ventral hernia  The various methods of treatment have been discussed with the patient and family. After consideration of risks, benefits and other options for treatment, the patient has consented to  Procedure(s): LAPAROSCOPIC VENTRAL HERNIA REPAIR/POSSIBLE OPEN (N/A) INSERTION OF MESH (N/A) as a surgical intervention .  The patient's history has been reviewed, patient examined, no change in status, stable for surgery.  I have reviewed the patient's chart and labs.  Questions were answered to the patient's satisfaction.     [name]

## 2013-06-24 NOTE — Preoperative (Signed)
Beta Blockers   Reason not to administer Beta Blockers:Not Applicable 
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
## 2013-06-24 NOTE — Anesthesia Postprocedure Evaluation (Signed)
Anesthesia Post Note  Patient: Jeffery Vasquez  Procedure(s) Performed: Procedure(s) (LRB): LAPAROSCOPIC assisted VENTRAL HERNIA REPAIR (N/A) INSERTION OF MESH (N/A)  Anesthesia type: General  Patient location: PACU  Post pain: Pain level controlled  Post assessment: Post-op Vital signs reviewed  Last Vitals:  Filed Vitals:   06/24/13 1450  BP: 136/65  Pulse: 66  Temp: 36.4 C  Resp: 15    Post vital signs: Reviewed  Level of consciousness: sedated  Complications: No apparent anesthesia complications

## 2013-06-24 NOTE — Transfer of Care (Signed)
Immediate Anesthesia Transfer of Care Note  Patient: Jeffery Vasquez  Procedure(s) Performed: Procedure(s): LAPAROSCOPIC assisted VENTRAL HERNIA REPAIR (N/A) INSERTION OF MESH (N/A)  Patient Location: PACU  Anesthesia Type:General  Level of Consciousness: awake, alert , oriented and patient cooperative  Airway & Oxygen Therapy: Patient Spontanous Breathing and Patient connected to face mask oxygen  Post-op Assessment: Report given to PACU RN, Post -op Vital signs reviewed and stable and Patient moving all extremities  Post vital signs: Reviewed and stable  Complications: No apparent anesthesia complications

## 2013-06-24 NOTE — Progress Notes (Signed)
Patient voided 100 cc  X 3 ,and 50 cc x 1 postop ,reports bladder feels full,bladder scan shows 630 cc.Jeffery Vasquez ,MD notified and orders received.

## 2013-06-25 ENCOUNTER — Encounter (HOSPITAL_COMMUNITY): Payer: Self-pay | Admitting: Surgery

## 2013-06-25 DIAGNOSIS — Z8719 Personal history of other diseases of the digestive system: Secondary | ICD-10-CM

## 2013-06-25 DIAGNOSIS — R319 Hematuria, unspecified: Secondary | ICD-10-CM | POA: Diagnosis not present

## 2013-06-25 DIAGNOSIS — K449 Diaphragmatic hernia without obstruction or gangrene: Secondary | ICD-10-CM | POA: Diagnosis not present

## 2013-06-25 DIAGNOSIS — K219 Gastro-esophageal reflux disease without esophagitis: Secondary | ICD-10-CM | POA: Diagnosis not present

## 2013-06-25 DIAGNOSIS — K439 Ventral hernia without obstruction or gangrene: Secondary | ICD-10-CM | POA: Diagnosis not present

## 2013-06-25 LAB — BASIC METABOLIC PANEL
BUN: 11 mg/dL (ref 6–23)
CO2: 26 mEq/L (ref 19–32)
Chloride: 102 mEq/L (ref 96–112)
Creatinine, Ser: 0.89 mg/dL (ref 0.50–1.35)
GFR calc non Af Amer: 85 mL/min — ABNORMAL LOW (ref >90)
Glucose, Bld: 131 mg/dL — ABNORMAL HIGH (ref 70–99)
Potassium: 4.5 mEq/L (ref 3.5–5.1)

## 2013-06-25 LAB — CBC
HCT: 40.5 % (ref 39.0–52.0)
Hemoglobin: 13.1 g/dL (ref 13.0–17.0)
MCH: 30 pg (ref 26.0–34.0)
MCHC: 32.3 g/dL (ref 30.0–36.0)
RBC: 4.36 MIL/uL (ref 4.22–5.81)

## 2013-06-25 MED ORDER — HYDROCODONE-ACETAMINOPHEN 7.5-325 MG/15ML PO SOLN: 15.0000 mL | Freq: Four times a day (QID) | ORAL | Status: DC | PRN

## 2013-06-25 NOTE — Discharge Summary (Signed)
Physician Discharge Summary  Patient ID: Jeffery Vasquez MRN: [id number] DOB/AGE: 69/14/1947 69 y.o.  Admit date: 06/24/2013 Discharge date: 06/25/2013  Admission Diagnoses:  Spontaneous midline epigastric hernia  Discharge Diagnoses:  same  Active Problems:   S/P repair of ventral hernia Dec 2014   Surgery:  Lap assisted ventral hernia repair with Fara Boros  Discharged Condition: improved  Hospital Course:   Had surgery.  Kept overnight for observation.  Ready for discharge in the morning.  Voiding.  He had some issues during the night with nursing interventions and didn't sleep well.    Consults: none  Significant Diagnostic Studies: none    Discharge Exam: Blood pressure 105/64, pulse 55, temperature 98 F (36.7 C), temperature source Oral, resp. rate 18, height 5\' 7"  (1.702 m), weight 168 lb (76.204 kg), SpO2 96.00%. Abdominal binder in place.  Voiding.  Minimal pain.   Disposition: 01-Home or Self Care  Discharge Orders   Future Appointments Provider Department Dept Phone   07/26/2013 9:00 AM Valarie Merino, MD [facility], [place] [phone]   Future Orders Complete By Expires   Diet - low sodium heart healthy  As directed    Discharge instructions  As directed    Comments:     Check with Dr. Vernie Ammons if difficulty voiding   Increase activity slowly  As directed    No wound care  As directed        Medication List         HYDROcodone-acetaminophen 7.5-325 mg/15 ml solution  Commonly known as:  HYCET  Take 15 mLs by mouth 4 (four) times daily as needed for moderate pain.           Follow-up Information   Follow up with Valarie Merino, MD.   Specialty:  General Surgery   Contact information:   [address] [place] [address] [phone]       Signed: Valarie Merino 06/25/2013, 7:25 AM

## 2013-06-25 NOTE — Progress Notes (Signed)
Present ivf infused -Patient refused new iv bag  - states he doesn't need it ,

## 2013-06-25 NOTE — Progress Notes (Signed)
Patient voided 650 cc clear urine.

## 2013-06-25 NOTE — Progress Notes (Signed)
Nurse Tech went in to in/out cath patient and reports she started to insert catheter and patient pushed  catheter away as she was inserting it.Patient refused to be cathed and  states he wants  numbing medicine used or he wants to be anesthetized,states he wants to see the urologist on call now.States he was given a muscle relaxant in 1993 when he had a brain tumor to make him urinate and he wants that muscle relaxant.,also states he wants his IVF stopped because he does not need IVF and he wants it stopped now.Patient very agitated.Algernon Huxley in to discuss patient requests and needs. D Newman,MD notified of the above requests.

## 2013-06-25 NOTE — Progress Notes (Signed)
Patient is alert and oriented, vital signs are stable, incisions are within normal limits, patient now voiding without any complications currently, patient is tolerating his diet without complaints of nausea or vomiting, will continue to monitor Stanford Breed RN 06-25-2013 9:10am

## 2013-07-03 DIAGNOSIS — H2510 Age-related nuclear cataract, unspecified eye: Secondary | ICD-10-CM | POA: Diagnosis not present

## 2013-07-11 DIAGNOSIS — H905 Unspecified sensorineural hearing loss: Secondary | ICD-10-CM | POA: Diagnosis not present

## 2013-07-11 DIAGNOSIS — J3489 Other specified disorders of nose and nasal sinuses: Secondary | ICD-10-CM | POA: Diagnosis not present

## 2013-07-11 DIAGNOSIS — R0982 Postnasal drip: Secondary | ICD-10-CM | POA: Diagnosis not present

## 2013-07-11 DIAGNOSIS — H903 Sensorineural hearing loss, bilateral: Secondary | ICD-10-CM | POA: Diagnosis not present

## 2013-07-11 DIAGNOSIS — D333 Benign neoplasm of cranial nerves: Secondary | ICD-10-CM | POA: Diagnosis not present

## 2013-07-26 ENCOUNTER — Ambulatory Visit (INDEPENDENT_AMBULATORY_CARE_PROVIDER_SITE_OTHER): Payer: Medicare Other | Admitting: Surgery

## 2013-07-26 ENCOUNTER — Encounter (INDEPENDENT_AMBULATORY_CARE_PROVIDER_SITE_OTHER): Payer: Self-pay

## 2013-07-26 ENCOUNTER — Encounter (INDEPENDENT_AMBULATORY_CARE_PROVIDER_SITE_OTHER): Payer: Self-pay | Admitting: Surgery

## 2013-07-26 VITALS — BP 120/70 | HR 76 | Temp 97.0°F | Resp 15 | Ht 67.0 in | Wt 164.4 lb

## 2013-07-26 DIAGNOSIS — Z9889 Other specified postprocedural states: Principal | ICD-10-CM

## 2013-07-26 DIAGNOSIS — Z8719 Personal history of other diseases of the digestive system: Secondary | ICD-10-CM

## 2013-07-26 NOTE — Progress Notes (Signed)
Jeffery Vasquez 68 y.o.  Body mass index is 25.74 kg/(m^2).  Patient Active Problem List   Diagnosis Date Noted  . S/P repair of ventral hernia Dec 2014 06/25/2013  . GERD (gastroesophageal reflux disease) 05/17/2013  . Status post excision of acoustic neuroma-right 05/17/2013    Allergies  Allergen Reactions  . Cephalexin Other (See Comments)    SEVERE STOMACH AND ABDOMINAL ISSUES  . Cephalosporins Other (See Comments)    SEVERE STOMACH AND ABDOMINAL ISSUES    Past Surgical History  Procedure Laterality Date  . Tonsillectomy  1946  . Nasal septum surgery  FEB 1975    REDO  08-23-1975  . Nasal sinus surgery  1981    MAXILLARY  . Removal tumor left shoulder  1984    DERMA CARCINOMA   . Vein ligation and stripping  1984    LEFT LEG  . Craniectomy for excision of acoustic neuroma Right 03-25-1992    RIGHT SIDE W/ NONSPARING OF NERVE  . Penile peyronie repair  2001  . Incontinence surgery  2010  . Colonoscopy with propofol  2013    AND EGD//  COLON POLYP REMOVED  . Cystoscopy with biopsy N/A 05/10/2013    Procedure: CYSTOSCOPY WITH BLADDER BIOPSY;  Surgeon: Claybon Jabs, MD;  Location: [facility];  Service: Urology;  Laterality: N/A;  . Arthoscopy  05/2013    rt knee - repair medial/lateral meniscus  . Ventral hernia repair N/A 06/24/2013    Procedure: LAPAROSCOPIC assisted VENTRAL HERNIA REPAIR;  Surgeon: Pedro Earls, MD;  Location: [facility];  Service: General;  Laterality: N/A;  . Insertion of mesh N/A 06/24/2013    Procedure: INSERTION OF MESH;  Surgeon: Pedro Earls, MD;  Location: [facility];  Service: General;  Laterality: N/A;  . Hernia repair     Lada, Satira Anis, MD 1. S/P repair of ventral hernia Dec 2014     Incision has healed very well.  Repair is intact.  Return prn.   Matt B. Hassell Done, MD, [facility] [phone] beeper [phone]  07/26/2013 9:54 AM

## 2013-07-29 DIAGNOSIS — Z9889 Other specified postprocedural states: Secondary | ICD-10-CM | POA: Diagnosis not present

## 2013-09-04 DIAGNOSIS — I10 Essential (primary) hypertension: Secondary | ICD-10-CM | POA: Diagnosis not present

## 2013-09-09 DIAGNOSIS — L219 Seborrheic dermatitis, unspecified: Secondary | ICD-10-CM | POA: Diagnosis not present

## 2013-09-09 DIAGNOSIS — Z85828 Personal history of other malignant neoplasm of skin: Secondary | ICD-10-CM | POA: Diagnosis not present

## 2013-09-09 DIAGNOSIS — D492 Neoplasm of unspecified behavior of bone, soft tissue, and skin: Secondary | ICD-10-CM | POA: Diagnosis not present

## 2013-10-22 DIAGNOSIS — D485 Neoplasm of uncertain behavior of skin: Secondary | ICD-10-CM | POA: Diagnosis not present

## 2013-10-22 DIAGNOSIS — L723 Sebaceous cyst: Secondary | ICD-10-CM | POA: Diagnosis not present

## 2013-11-07 DIAGNOSIS — R972 Elevated prostate specific antigen [PSA]: Secondary | ICD-10-CM | POA: Diagnosis not present

## 2013-11-12 DIAGNOSIS — R972 Elevated prostate specific antigen [PSA]: Secondary | ICD-10-CM | POA: Diagnosis not present

## 2013-11-12 DIAGNOSIS — N138 Other obstructive and reflux uropathy: Secondary | ICD-10-CM | POA: Diagnosis not present

## 2013-11-12 DIAGNOSIS — N401 Enlarged prostate with lower urinary tract symptoms: Secondary | ICD-10-CM | POA: Diagnosis not present

## 2013-11-12 DIAGNOSIS — N139 Obstructive and reflux uropathy, unspecified: Secondary | ICD-10-CM | POA: Diagnosis not present

## 2013-12-04 DIAGNOSIS — Z8249 Family history of ischemic heart disease and other diseases of the circulatory system: Secondary | ICD-10-CM | POA: Diagnosis not present

## 2013-12-04 DIAGNOSIS — E785 Hyperlipidemia, unspecified: Secondary | ICD-10-CM | POA: Diagnosis not present

## 2013-12-04 DIAGNOSIS — R319 Hematuria, unspecified: Secondary | ICD-10-CM | POA: Diagnosis not present

## 2014-01-08 DIAGNOSIS — Z85828 Personal history of other malignant neoplasm of skin: Secondary | ICD-10-CM | POA: Diagnosis not present

## 2014-01-08 DIAGNOSIS — Z1389 Encounter for screening for other disorder: Secondary | ICD-10-CM | POA: Diagnosis not present

## 2014-01-24 DIAGNOSIS — R0789 Other chest pain: Secondary | ICD-10-CM | POA: Diagnosis not present

## 2014-01-24 DIAGNOSIS — I498 Other specified cardiac arrhythmias: Secondary | ICD-10-CM | POA: Diagnosis not present

## 2014-01-24 DIAGNOSIS — R079 Chest pain, unspecified: Secondary | ICD-10-CM | POA: Diagnosis not present

## 2014-04-22 DIAGNOSIS — H2513 Age-related nuclear cataract, bilateral: Secondary | ICD-10-CM | POA: Diagnosis not present

## 2014-05-07 DIAGNOSIS — I10 Essential (primary) hypertension: Secondary | ICD-10-CM | POA: Diagnosis not present

## 2014-05-07 DIAGNOSIS — K469 Unspecified abdominal hernia without obstruction or gangrene: Secondary | ICD-10-CM | POA: Diagnosis not present

## 2014-05-12 DIAGNOSIS — Z23 Encounter for immunization: Secondary | ICD-10-CM | POA: Diagnosis not present

## 2014-06-03 DIAGNOSIS — S62173A Displaced fracture of trapezium [larger multangular], unspecified wrist, initial encounter for closed fracture: Secondary | ICD-10-CM

## 2014-06-03 HISTORY — DX: Displaced fracture of trapezium (larger multangular), unspecified wrist, initial encounter for closed fracture: S62.173A

## 2014-06-10 DIAGNOSIS — Z23 Encounter for immunization: Secondary | ICD-10-CM | POA: Diagnosis not present

## 2014-06-10 DIAGNOSIS — R972 Elevated prostate specific antigen [PSA]: Secondary | ICD-10-CM | POA: Diagnosis not present

## 2014-06-10 DIAGNOSIS — S00511A Abrasion of lip, initial encounter: Secondary | ICD-10-CM | POA: Diagnosis not present

## 2014-06-10 DIAGNOSIS — S8992XA Unspecified injury of left lower leg, initial encounter: Secondary | ICD-10-CM | POA: Diagnosis not present

## 2014-06-10 DIAGNOSIS — N529 Male erectile dysfunction, unspecified: Secondary | ICD-10-CM | POA: Diagnosis not present

## 2014-06-10 DIAGNOSIS — N138 Other obstructive and reflux uropathy: Secondary | ICD-10-CM | POA: Diagnosis not present

## 2014-06-10 DIAGNOSIS — M25562 Pain in left knee: Secondary | ICD-10-CM | POA: Diagnosis not present

## 2014-06-10 DIAGNOSIS — S8002XA Contusion of left knee, initial encounter: Secondary | ICD-10-CM | POA: Diagnosis not present

## 2014-06-10 DIAGNOSIS — S025XXA Fracture of tooth (traumatic), initial encounter for closed fracture: Secondary | ICD-10-CM | POA: Diagnosis not present

## 2014-06-10 DIAGNOSIS — N401 Enlarged prostate with lower urinary tract symptoms: Secondary | ICD-10-CM | POA: Diagnosis not present

## 2014-06-10 DIAGNOSIS — S63512A Sprain of carpal joint of left wrist, initial encounter: Secondary | ICD-10-CM | POA: Diagnosis not present

## 2014-06-10 DIAGNOSIS — S638X1A Sprain of other part of right wrist and hand, initial encounter: Secondary | ICD-10-CM | POA: Diagnosis not present

## 2014-06-10 DIAGNOSIS — Z87820 Personal history of traumatic brain injury: Secondary | ICD-10-CM | POA: Diagnosis not present

## 2014-06-10 DIAGNOSIS — E291 Testicular hypofunction: Secondary | ICD-10-CM | POA: Diagnosis not present

## 2014-06-10 DIAGNOSIS — S6292XA Unspecified fracture of left wrist and hand, initial encounter for closed fracture: Secondary | ICD-10-CM | POA: Diagnosis not present

## 2014-06-10 DIAGNOSIS — N486 Induration penis plastica: Secondary | ICD-10-CM | POA: Diagnosis not present

## 2014-06-10 DIAGNOSIS — K0381 Cracked tooth: Secondary | ICD-10-CM | POA: Diagnosis not present

## 2014-06-10 DIAGNOSIS — H9191 Unspecified hearing loss, right ear: Secondary | ICD-10-CM | POA: Diagnosis not present

## 2014-06-11 DIAGNOSIS — S8002XA Contusion of left knee, initial encounter: Secondary | ICD-10-CM | POA: Diagnosis not present

## 2014-06-11 DIAGNOSIS — S62175A Nondisplaced fracture of trapezium [larger multangular], left wrist, initial encounter for closed fracture: Secondary | ICD-10-CM | POA: Diagnosis not present

## 2014-06-11 DIAGNOSIS — S8012XA Contusion of left lower leg, initial encounter: Secondary | ICD-10-CM | POA: Diagnosis not present

## 2014-06-18 DIAGNOSIS — Z0000 Encounter for general adult medical examination without abnormal findings: Secondary | ICD-10-CM | POA: Diagnosis not present

## 2014-06-19 DIAGNOSIS — S62175D Nondisplaced fracture of trapezium [larger multangular], left wrist, subsequent encounter for fracture with routine healing: Secondary | ICD-10-CM | POA: Diagnosis not present

## 2014-06-20 DIAGNOSIS — N281 Cyst of kidney, acquired: Secondary | ICD-10-CM | POA: Diagnosis not present

## 2014-06-20 DIAGNOSIS — R310 Gross hematuria: Secondary | ICD-10-CM | POA: Diagnosis not present

## 2014-07-05 DIAGNOSIS — J019 Acute sinusitis, unspecified: Secondary | ICD-10-CM | POA: Diagnosis not present

## 2014-07-07 DIAGNOSIS — S62175D Nondisplaced fracture of trapezium [larger multangular], left wrist, subsequent encounter for fracture with routine healing: Secondary | ICD-10-CM | POA: Diagnosis not present

## 2014-07-10 DIAGNOSIS — R310 Gross hematuria: Secondary | ICD-10-CM | POA: Diagnosis not present

## 2014-07-10 DIAGNOSIS — R972 Elevated prostate specific antigen [PSA]: Secondary | ICD-10-CM | POA: Diagnosis not present

## 2014-07-15 DIAGNOSIS — J069 Acute upper respiratory infection, unspecified: Secondary | ICD-10-CM | POA: Diagnosis not present

## 2014-07-15 DIAGNOSIS — R05 Cough: Secondary | ICD-10-CM | POA: Diagnosis not present

## 2014-07-31 DIAGNOSIS — J322 Chronic ethmoidal sinusitis: Secondary | ICD-10-CM | POA: Diagnosis not present

## 2014-08-01 DIAGNOSIS — H2513 Age-related nuclear cataract, bilateral: Secondary | ICD-10-CM | POA: Diagnosis not present

## 2014-10-11 ENCOUNTER — Emergency Department
Admit: 2014-10-11 | Disposition: A | Discharge status: Home / Self Care | Payer: Self-pay | Admitting: Emergency Medicine

## 2014-10-11 DIAGNOSIS — K5792 Diverticulitis of intestine, part unspecified, without perforation or abscess without bleeding: Secondary | ICD-10-CM | POA: Diagnosis not present

## 2014-10-11 DIAGNOSIS — R109 Unspecified abdominal pain: Secondary | ICD-10-CM | POA: Diagnosis not present

## 2014-10-11 DIAGNOSIS — N400 Enlarged prostate without lower urinary tract symptoms: Secondary | ICD-10-CM | POA: Diagnosis not present

## 2014-10-11 DIAGNOSIS — K6389 Other specified diseases of intestine: Secondary | ICD-10-CM | POA: Diagnosis not present

## 2014-10-11 DIAGNOSIS — R1032 Left lower quadrant pain: Secondary | ICD-10-CM | POA: Diagnosis not present

## 2014-10-11 LAB — CBC WITH DIFFERENTIAL/PLATELET
Basophil #: 0.1 10*3/uL (ref 0.0–0.1)
Basophil %: 0.8 %
EOS ABS: 0 10*3/uL (ref 0.0–0.7)
Eosinophil %: 0.4 %
HCT: 41.6 % (ref 40.0–52.0)
HGB: 13.5 g/dL (ref 13.0–18.0)
LYMPHS PCT: 7.7 %
Lymphocyte #: 0.9 10*3/uL — ABNORMAL LOW (ref 1.0–3.6)
MCH: 29.3 pg (ref 26.0–34.0)
MCHC: 32.5 g/dL (ref 32.0–36.0)
MCV: 90 fL (ref 80–100)
MONO ABS: 1 x10 3/mm (ref 0.2–1.0)
Monocyte %: 9 %
NEUTROS PCT: 82.1 %
Neutrophil #: 9.1 10*3/uL — ABNORMAL HIGH (ref 1.4–6.5)
PLATELETS: 147 10*3/uL — AB (ref 150–440)
RBC: 4.62 10*6/uL (ref 4.40–5.90)
RDW: 15.1 % — ABNORMAL HIGH (ref 11.5–14.5)
WBC: 11 10*3/uL — ABNORMAL HIGH (ref 3.8–10.6)

## 2014-10-11 LAB — COMPREHENSIVE METABOLIC PANEL
ALT: 19 U/L
AST: 23 U/L
Albumin: 3.9 g/dL
Alkaline Phosphatase: 48 U/L
Anion Gap: 7 (ref 7–16)
BUN: 16 mg/dL
Bilirubin,Total: 1 mg/dL
CO2: 28 mmol/L
Calcium, Total: 8.5 mg/dL — ABNORMAL LOW
Chloride: 101 mmol/L
Creatinine: 1.01 mg/dL
EGFR (Non-African Amer.): >60
Glucose: 109 mg/dL — ABNORMAL HIGH
Potassium: 4.1 mmol/L
Sodium: 136 mmol/L
Total Protein: 6.9 g/dL

## 2014-10-11 LAB — URINALYSIS, COMPLETE
BACTERIA: NONE SEEN
BLOOD: NEGATIVE
Bilirubin,UR: NEGATIVE
GLUCOSE, UR: NEGATIVE mg/dL (ref 0–75)
Leukocyte Esterase: NEGATIVE
NITRITE: NEGATIVE
Ph: 5 (ref 4.5–8.0)
Protein: 30
RBC,UR: NONE SEEN /HPF (ref 0–5)
SPECIFIC GRAVITY: 1.03 (ref 1.003–1.030)

## 2014-10-11 LAB — TROPONIN I: Troponin-I: <0.03 ng/mL

## 2014-10-31 DIAGNOSIS — K219 Gastro-esophageal reflux disease without esophagitis: Secondary | ICD-10-CM | POA: Diagnosis not present

## 2014-10-31 DIAGNOSIS — R1013 Epigastric pain: Secondary | ICD-10-CM | POA: Diagnosis not present

## 2014-10-31 DIAGNOSIS — R1319 Other dysphagia: Secondary | ICD-10-CM | POA: Diagnosis not present

## 2014-11-11 DIAGNOSIS — R972 Elevated prostate specific antigen [PSA]: Secondary | ICD-10-CM | POA: Diagnosis not present

## 2014-11-13 DIAGNOSIS — R1319 Other dysphagia: Secondary | ICD-10-CM | POA: Diagnosis not present

## 2014-11-13 DIAGNOSIS — K219 Gastro-esophageal reflux disease without esophagitis: Secondary | ICD-10-CM | POA: Diagnosis not present

## 2014-11-18 DIAGNOSIS — N529 Male erectile dysfunction, unspecified: Secondary | ICD-10-CM | POA: Diagnosis not present

## 2014-11-18 DIAGNOSIS — R972 Elevated prostate specific antigen [PSA]: Secondary | ICD-10-CM | POA: Diagnosis not present

## 2014-11-19 DIAGNOSIS — R12 Heartburn: Secondary | ICD-10-CM | POA: Diagnosis not present

## 2014-11-28 ENCOUNTER — Encounter: Payer: Self-pay | Admitting: *Deleted

## 2014-12-05 NOTE — Discharge Instructions (Signed)

## 2014-12-08 ENCOUNTER — Other Ambulatory Visit: Payer: Self-pay | Admitting: Gastroenterology

## 2014-12-08 ENCOUNTER — Ambulatory Visit: Payer: Medicare Other | Admitting: Anesthesiology

## 2014-12-08 ENCOUNTER — Encounter
Admission: RE | Disposition: A | Discharge status: Home / Self Care | Payer: Self-pay | Source: Ambulatory Visit | Attending: Gastroenterology

## 2014-12-08 ENCOUNTER — Encounter: Payer: Self-pay | Admitting: *Deleted

## 2014-12-08 ENCOUNTER — Ambulatory Visit
Admission: RE | Admit: 2014-12-08 | Discharge: 2014-12-08 | Disposition: A | Discharge status: Home / Self Care | Payer: Medicare Other | Source: Ambulatory Visit | Attending: Gastroenterology | Admitting: Gastroenterology

## 2014-12-08 DIAGNOSIS — Z974 Presence of external hearing-aid: Secondary | ICD-10-CM | POA: Insufficient documentation

## 2014-12-08 DIAGNOSIS — K5730 Diverticulosis of large intestine without perforation or abscess without bleeding: Secondary | ICD-10-CM | POA: Insufficient documentation

## 2014-12-08 DIAGNOSIS — R12 Heartburn: Secondary | ICD-10-CM | POA: Insufficient documentation

## 2014-12-08 DIAGNOSIS — Z9889 Other specified postprocedural states: Secondary | ICD-10-CM | POA: Diagnosis not present

## 2014-12-08 DIAGNOSIS — Z86010 Personal history of colonic polyps: Secondary | ICD-10-CM | POA: Diagnosis not present

## 2014-12-08 DIAGNOSIS — Z881 Allergy status to other antibiotic agents status: Secondary | ICD-10-CM | POA: Diagnosis not present

## 2014-12-08 DIAGNOSIS — K2950 Unspecified chronic gastritis without bleeding: Secondary | ICD-10-CM | POA: Diagnosis not present

## 2014-12-08 DIAGNOSIS — D124 Benign neoplasm of descending colon: Secondary | ICD-10-CM | POA: Diagnosis not present

## 2014-12-08 DIAGNOSIS — Z8669 Personal history of other diseases of the nervous system and sense organs: Secondary | ICD-10-CM | POA: Diagnosis not present

## 2014-12-08 DIAGNOSIS — Z85828 Personal history of other malignant neoplasm of skin: Secondary | ICD-10-CM | POA: Insufficient documentation

## 2014-12-08 DIAGNOSIS — H905 Unspecified sensorineural hearing loss: Secondary | ICD-10-CM | POA: Diagnosis not present

## 2014-12-08 DIAGNOSIS — K228 Other specified diseases of esophagus: Secondary | ICD-10-CM | POA: Diagnosis not present

## 2014-12-08 DIAGNOSIS — K210 Gastro-esophageal reflux disease with esophagitis: Secondary | ICD-10-CM | POA: Diagnosis not present

## 2014-12-08 DIAGNOSIS — K2970 Gastritis, unspecified, without bleeding: Secondary | ICD-10-CM | POA: Insufficient documentation

## 2014-12-08 DIAGNOSIS — Z87442 Personal history of urinary calculi: Secondary | ICD-10-CM | POA: Diagnosis not present

## 2014-12-08 DIAGNOSIS — D125 Benign neoplasm of sigmoid colon: Secondary | ICD-10-CM | POA: Diagnosis not present

## 2014-12-08 DIAGNOSIS — D122 Benign neoplasm of ascending colon: Secondary | ICD-10-CM | POA: Insufficient documentation

## 2014-12-08 DIAGNOSIS — M5136 Other intervertebral disc degeneration, lumbar region: Secondary | ICD-10-CM | POA: Insufficient documentation

## 2014-12-08 DIAGNOSIS — H532 Diplopia: Secondary | ICD-10-CM | POA: Insufficient documentation

## 2014-12-08 HISTORY — PX: ESOPHAGOGASTRODUODENOSCOPY: SHX5428

## 2014-12-08 HISTORY — PX: POLYPECTOMY: SHX149

## 2014-12-08 HISTORY — PX: COLONOSCOPY: SHX5424

## 2014-12-08 SURGERY — COLONOSCOPY
Anesthesia: Monitor Anesthesia Care | Wound class: Contaminated

## 2014-12-08 MED ORDER — LIDOCAINE HCL (CARDIAC) 20 MG/ML IV SOLN
INTRAVENOUS | Status: DC | PRN
  Administered 2014-12-08: 50 mg via INTRAVENOUS

## 2014-12-08 MED ORDER — LACTATED RINGERS IV SOLN
INTRAVENOUS | Status: DC
Start: 2014-12-08 — End: 2014-12-08
  Administered 2014-12-08: 08:00:00 via INTRAVENOUS

## 2014-12-08 MED ORDER — SIMETHICONE 40 MG/0.6ML PO SUSP
ORAL | Status: DC | PRN
  Administered 2014-12-08: 09:00:00

## 2014-12-08 MED ORDER — PROPOFOL 10 MG/ML IV BOLUS
INTRAVENOUS | Status: DC | PRN
  Administered 2014-12-08: 20 mg via INTRAVENOUS
  Administered 2014-12-08: 30 mg via INTRAVENOUS
  Administered 2014-12-08: 50 mg via INTRAVENOUS
  Administered 2014-12-08 (×6): 30 mg via INTRAVENOUS
  Administered 2014-12-08: 10 mg via INTRAVENOUS
  Administered 2014-12-08: 30 mg via INTRAVENOUS
  Administered 2014-12-08: 20 mg via INTRAVENOUS
  Administered 2014-12-08: 30 mg via INTRAVENOUS

## 2014-12-08 MED ORDER — ACETAMINOPHEN 160 MG/5ML PO SOLN: 325.0000 mg | ORAL | Status: DC | PRN

## 2014-12-08 MED ORDER — GLYCOPYRROLATE 0.2 MG/ML IJ SOLN
INTRAMUSCULAR | Status: DC | PRN
  Administered 2014-12-08: 0.1 mg via INTRAVENOUS

## 2014-12-08 MED ORDER — ACETAMINOPHEN 325 MG PO TABS
325.0000 mg | ORAL_TABLET | ORAL | Status: DC | PRN
Start: 2014-12-08 — End: 2014-12-08

## 2014-12-08 SURGICAL SUPPLY — 39 items
BALLN DILATOR 10-12 8 (BALLOONS)
BALLN DILATOR 12-15 8 (BALLOONS)
BALLN DILATOR 15-18 8 (BALLOONS)
BALLN DILATOR CRE 0-12 8 (BALLOONS)
BALLN DILATOR ESOPH 8 10 CRE (MISCELLANEOUS) IMPLANT
BALLOON DILATOR 12-15 8 (BALLOONS) IMPLANT
BALLOON DILATOR 15-18 8 (BALLOONS) IMPLANT
BALLOON DILATOR CRE 0-12 8 (BALLOONS) IMPLANT
BLOCK BITE 60FR ADLT L/F GRN (MISCELLANEOUS) ×3 IMPLANT
CANISTER SUCT 1200ML W/VALVE (MISCELLANEOUS) ×3 IMPLANT
FCP ESCP3.2XJMB 240X2.8X (MISCELLANEOUS)
FORCEPS BIOP RAD 4 LRG CAP 4 (CUTTING FORCEPS) ×6 IMPLANT
FORCEPS BIOP RJ4 240 W/NDL (MISCELLANEOUS)
FORCEPS ESCP3.2XJMB 240X2.8X (MISCELLANEOUS) IMPLANT
GOWN CVR UNV OPN BCK APRN NK (MISCELLANEOUS) ×4 IMPLANT
GOWN ISOL THUMB LOOP REG UNIV (MISCELLANEOUS) ×2
HEMOCLIP INSTINCT (CLIP) IMPLANT
INJECTOR VARIJECT VIN23 (MISCELLANEOUS) IMPLANT
KIT CO2 TUBING (TUBING) IMPLANT
KIT DEFENDO VALVE AND CONN (KITS) IMPLANT
KIT ENDO PROCEDURE OLY (KITS) ×3 IMPLANT
LIGATOR MULTIBAND 6SHOOTER MBL (MISCELLANEOUS) IMPLANT
MARKER SPOT ENDO TATTOO 5ML (MISCELLANEOUS) IMPLANT
PAD GROUND ADULT SPLIT (MISCELLANEOUS) IMPLANT
SNARE SHORT THROW 13M SML OVAL (MISCELLANEOUS) ×3 IMPLANT
SNARE SHORT THROW 30M LRG OVAL (MISCELLANEOUS) IMPLANT
SPOT EX ENDOSCOPIC TATTOO (MISCELLANEOUS)
SUCTION POLY TRAP 4CHAMBER (MISCELLANEOUS) IMPLANT
SYR INFLATION 60ML (SYRINGE) IMPLANT
TRAP SUCTION POLY (MISCELLANEOUS) ×3 IMPLANT
TUBING CONN 6MMX3.1M (TUBING)
TUBING SUCTION CONN 0.25 STRL (TUBING) IMPLANT
UNDERPAD 30X60 958B10 (PK) (MISCELLANEOUS) IMPLANT
VALVE BIOPSY ENDO (VALVE) IMPLANT
VARIJECT INJECTOR VIN23 (MISCELLANEOUS)
WATER AUXILLARY (MISCELLANEOUS) IMPLANT
WATER STERILE IRR 250ML POUR (IV SOLUTION) ×3 IMPLANT
WATER STERILE IRR 500ML POUR (IV SOLUTION) IMPLANT
WIRE CRE 18-20MM 8CM F G (MISCELLANEOUS) IMPLANT

## 2014-12-08 NOTE — Anesthesia Procedure Notes (Signed)
Procedure Name: MAC Date/Time: 12/08/2014 8:56 AM Performed by: Mayme Genta Pre-anesthesia Checklist: Patient identified, Emergency Drugs available, Suction available, Timeout performed and Patient being monitored Patient Re-evaluated:Patient Re-evaluated prior to inductionOxygen Delivery Method: Nasal cannula Placement Confirmation: positive ETCO2

## 2014-12-08 NOTE — Anesthesia Postprocedure Evaluation (Signed)
  Anesthesia Post-op Note  Patient: Jeffery Vasquez  Procedure(s) Performed: Procedure(s): COLONOSCOPY (N/A) ESOPHAGOGASTRODUODENOSCOPY (EGD) (N/A) POLYPECTOMY INTESTINAL  Anesthesia type:MAC  Patient location: PACU  Post pain: Pain level controlled  Post assessment: Post-op Vital signs reviewed, Patient's Cardiovascular Status Stable, Respiratory Function Stable, Patent Airway and No signs of Nausea or vomiting  Post vital signs: Reviewed and stable  Last Vitals:  Filed Vitals:   12/08/14 0931  BP: 85/52  Pulse: 67  Temp: 36.6 C  Resp: 18    Level of consciousness: awake, alert  and patient cooperative  Complications: No apparent anesthesia complications

## 2014-12-08 NOTE — Anesthesia Preprocedure Evaluation (Addendum)
Anesthesia Evaluation  Patient identified by MRN, date of birth, ID band  Reviewed: Allergy & Precautions, H&P , NPO status , Patient's Chart, lab work & pertinent test results  Airway Mallampati: II  TM Distance: >3 FB Neck ROM: full    Dental no notable dental hx.    Pulmonary    Pulmonary exam normal       Cardiovascular Rhythm:regular Rate:Normal     Neuro/Psych    GI/Hepatic   Endo/Other    Renal/GU      Musculoskeletal   Abdominal   Peds  Hematology   Anesthesia Other Findings   Reproductive/Obstetrics                             Anesthesia Physical Anesthesia Plan  ASA: II  Anesthesia Plan: MAC   Post-op Pain Management:    Induction:   Airway Management Planned:   Additional Equipment:   Intra-op Plan:   Post-operative Plan:   Informed Consent: I have reviewed the patients History and Physical, chart, labs and discussed the procedure including the risks, benefits and alternatives for the proposed anesthesia with the patient or authorized representative who has indicated his/her understanding and acceptance.     Plan Discussed with: CRNA  Anesthesia Plan Comments:        Anesthesia Quick Evaluation
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
## 2014-12-08 NOTE — Transfer of Care (Signed)
Immediate Anesthesia Transfer of Care Note  Patient: Jeffery Vasquez  Procedure(s) Performed: Procedure(s): COLONOSCOPY (N/A) ESOPHAGOGASTRODUODENOSCOPY (EGD) (N/A) POLYPECTOMY INTESTINAL  Patient Location: PACU  Anesthesia Type: MAC  Level of Consciousness: awake, alert  and patient cooperative  Airway and Oxygen Therapy: Patient Spontanous Breathing and Patient connected to supplemental oxygen  Post-op Assessment: Post-op Vital signs reviewed, Patient's Cardiovascular Status Stable, Respiratory Function Stable, Patent Airway and No signs of Nausea or vomiting  Post-op Vital Signs: Reviewed and stable  Complications: No apparent anesthesia complications

## 2014-12-08 NOTE — Op Note (Signed)
[facility] Patient Name: Jeffery Vasquez Procedure Date: 12/08/2014 7:19 AM MRN: [id number] Account #: [id number] Date of Birth: 1945-11-18 Admit Type: Outpatient Age: 69 Room: [facility] OR ROOM 01 Gender: Male Note Status: Finalized Procedure:         Colonoscopy Indications:       High risk colon cancer surveillance: Personal history of                     colonic polyps Providers:         Lucilla Lame, MD Referring MD:      Arnetha Courser (Referring MD) Medicines:         Propofol per Anesthesia Complications:     No immediate complications. Procedure:         Pre-Anesthesia Assessment:                    - Prior to the procedure, a History and Physical was                     performed, and patient medications and allergies were                     reviewed. The patient's tolerance of previous anesthesia                     was also reviewed. The risks and benefits of the procedure                     and the sedation options and risks were discussed with the                     patient. All questions were answered, and informed consent                     was obtained. Prior Anticoagulants: The patient has taken                     no previous anticoagulant or antiplatelet agents. ASA                     Grade Assessment: II - A patient with mild systemic                     disease. After reviewing the risks and benefits, the                     patient was deemed in satisfactory condition to undergo                     the procedure.                    After obtaining informed consent, the colonoscope was                     passed under direct vision. Throughout the procedure, the                     patient's blood pressure, pulse, and oxygen saturations                     were monitored continuously. The Olympus CF-HQ190L  Colonoscope (S#. [id number]) was introduced through the anus                     and advanced to the the  cecum, identified by appendiceal                     orifice and ileocecal valve. The colonoscopy was performed                     without difficulty. The patient tolerated the procedure                     well. The quality of the bowel preparation was excellent. Findings:      The perianal and digital rectal examinations were normal.      Two sessile polyps were found in the ascending colon. The polyps were 4       to 6 mm in size. These polyps were removed with a cold snare. Resection       and retrieval were complete.      Three sessile polyps were found in the descending colon. The polyps were       4 to 7 mm in size. These polyps were removed with a cold snare.       Resection and retrieval were complete.      A 6 mm polyp was found in the sigmoid colon. The polyp was pedunculated.       The polyp was removed with a cold snare. Resection and retrieval were       complete.      Multiple small-mouthed diverticula were found in the sigmoid colon. Impression:        - Two 4 to 6 mm polyps in the ascending colon. Resected                     and retrieved.                    - Three 4 to 7 mm polyps in the descending colon. Resected                     and retrieved.                    - One 6 mm polyp in the sigmoid colon. Resected and                     retrieved.                    - Diverticulosis in the sigmoid colon. Recommendation:    - Await pathology results.                    - Repeat colonoscopy in 5 years for surveillance. Procedure Code(s): --- Professional ---                    [phone], Colonoscopy, flexible; with removal of tumor(s),                     polyp(s), or other lesion(s) by snare technique Diagnosis Code(s): --- Professional ---                    Z86.010, Personal history of colonic polyps  D12.2, Benign neoplasm of ascending colon                    D12.4, Benign neoplasm of descending colon                    D12.5, Benign neoplasm of  sigmoid colon CPT copyright 2014 American Medical Association. All rights reserved. The codes documented in this report are preliminary and upon coder review may  be revised to meet current compliance requirements. Lucilla Lame, MD 12/08/2014 9:29:31 AM This report has been signed electronically. Number of Addenda: 0 Note Initiated On: 12/08/2014 7:19 AM Scope Withdrawal Time: 0 hours 10 minutes 59 seconds  Total Procedure Duration: 0 hours 17 minutes 31 seconds       [facility]

## 2014-12-08 NOTE — Op Note (Signed)
[facility] Patient Name: Jeffery Vasquez Procedure Date: 12/08/2014 7:18 AM MRN: [id number] Account #: [id number] Date of Birth: 1946-02-27 Admit Type: Outpatient Age: 69 Room: [facility] OR ROOM 01 Gender: Male Note Status: Finalized Procedure:         Upper GI endoscopy Indications:       Heartburn Providers:         Lucilla Lame, MD Referring MD:      Arnetha Courser (Referring MD) Medicines:         Propofol per Anesthesia Complications:     No immediate complications. Procedure:         Pre-Anesthesia Assessment:                    - Prior to the procedure, a History and Physical was                     performed, and patient medications and allergies were                     reviewed. The patient's tolerance of previous anesthesia                     was also reviewed. The risks and benefits of the procedure                     and the sedation options and risks were discussed with the                     patient. All questions were answered, and informed consent                     was obtained. Prior Anticoagulants: The patient has taken                     no previous anticoagulant or antiplatelet agents. ASA                     Grade Assessment: II - A patient with mild systemic                     disease. After reviewing the risks and benefits, the                     patient was deemed in satisfactory condition to undergo                     the procedure.                    After obtaining informed consent, the endoscope was passed                     under direct vision. Throughout the procedure, the                     patient's blood pressure, pulse, and oxygen saturations                     were monitored continuously. The Olympus GIF-HQ190                     Endoscope (S#. [id number]) was introduced through the mouth,  and advanced to the second part of duodenum. The upper GI                     endoscopy was  accomplished without difficulty. The patient                     tolerated the procedure well. Findings:      The Z-line was irregular and was found at the gastroesophageal junction.       Biopsies were taken with a cold forceps for histology.      LA Grade A (one or more mucosal breaks less than 5 mm, not extending       between tops of 2 mucosal folds) esophagitis with no bleeding was found       in the lower third of the esophagus. Biopsies were taken with a cold       forceps for histology.      Localized mild inflammation characterized by erythema was found in the       gastric antrum. Biopsies were taken with a cold forceps for histology.      The examined duodenum was normal. Impression:        - Z-line irregular, at the gastroesophageal junction.                     Biopsied.                    - LA Grade A reflux esophagitis. Biopsied.                    - Gastritis. Biopsied.                    - Normal examined duodenum. Recommendation:    - Await pathology results.                    - Perform a colonoscopy today. Procedure Code(s): --- Professional ---                    [phone], Esophagogastroduodenoscopy, flexible, transoral;                     with biopsy, single or multiple Diagnosis Code(s): --- Professional ---                    R12, Heartburn                    K22.8, Other specified diseases of esophagus                    K21.0, Gastro-esophageal reflux disease with esophagitis                    K29.70, Gastritis, unspecified, without bleeding CPT copyright 2014 American Medical Association. All rights reserved. The codes documented in this report are preliminary and upon coder review may  be revised to meet current compliance requirements. Lucilla Lame, MD 12/08/2014 9:07:40 AM This report has been signed electronically. Number of Addenda: 0 Note Initiated On: 12/08/2014 7:18 AM Total Procedure Duration: 0 hours 3 minutes 49 seconds       [facility]

## 2014-12-09 ENCOUNTER — Encounter: Payer: Self-pay | Admitting: Gastroenterology

## 2014-12-15 ENCOUNTER — Encounter: Payer: Self-pay | Admitting: Gastroenterology

## 2015-01-27 DIAGNOSIS — H532 Diplopia: Secondary | ICD-10-CM | POA: Diagnosis not present

## 2015-04-13 DIAGNOSIS — H903 Sensorineural hearing loss, bilateral: Secondary | ICD-10-CM | POA: Diagnosis not present

## 2015-04-13 DIAGNOSIS — J301 Allergic rhinitis due to pollen: Secondary | ICD-10-CM | POA: Diagnosis not present

## 2015-04-13 DIAGNOSIS — H833X3 Noise effects on inner ear, bilateral: Secondary | ICD-10-CM | POA: Diagnosis not present

## 2015-04-13 DIAGNOSIS — D333 Benign neoplasm of cranial nerves: Secondary | ICD-10-CM | POA: Diagnosis not present

## 2015-04-13 DIAGNOSIS — J069 Acute upper respiratory infection, unspecified: Secondary | ICD-10-CM | POA: Diagnosis not present

## 2015-04-13 DIAGNOSIS — H905 Unspecified sensorineural hearing loss: Secondary | ICD-10-CM | POA: Diagnosis not present

## 2015-05-21 DIAGNOSIS — H1132 Conjunctival hemorrhage, left eye: Secondary | ICD-10-CM | POA: Diagnosis not present

## 2015-07-02 DIAGNOSIS — I781 Nevus, non-neoplastic: Secondary | ICD-10-CM | POA: Diagnosis not present

## 2015-07-02 DIAGNOSIS — D229 Melanocytic nevi, unspecified: Secondary | ICD-10-CM | POA: Diagnosis not present

## 2015-07-02 DIAGNOSIS — L570 Actinic keratosis: Secondary | ICD-10-CM | POA: Diagnosis not present

## 2015-07-02 DIAGNOSIS — L821 Other seborrheic keratosis: Secondary | ICD-10-CM | POA: Diagnosis not present

## 2015-07-02 DIAGNOSIS — L905 Scar conditions and fibrosis of skin: Secondary | ICD-10-CM | POA: Diagnosis not present

## 2015-07-02 DIAGNOSIS — C4499 Other specified malignant neoplasm of skin, unspecified: Secondary | ICD-10-CM | POA: Diagnosis not present

## 2015-07-08 DIAGNOSIS — J019 Acute sinusitis, unspecified: Secondary | ICD-10-CM | POA: Diagnosis not present

## 2015-07-09 ENCOUNTER — Telehealth: Payer: Self-pay | Admitting: Family Medicine

## 2015-07-09 ENCOUNTER — Encounter: Payer: Self-pay | Admitting: Family Medicine

## 2015-07-09 ENCOUNTER — Ambulatory Visit (INDEPENDENT_AMBULATORY_CARE_PROVIDER_SITE_OTHER): Payer: Medicare Other | Admitting: Family Medicine

## 2015-07-09 VITALS — BP 137/76 | HR 61 | Temp 98.6°F | Wt 182.0 lb

## 2015-07-09 DIAGNOSIS — R52 Pain, unspecified: Secondary | ICD-10-CM | POA: Diagnosis not present

## 2015-07-09 DIAGNOSIS — R319 Hematuria, unspecified: Secondary | ICD-10-CM | POA: Insufficient documentation

## 2015-07-09 DIAGNOSIS — R972 Elevated prostate specific antigen [PSA]: Secondary | ICD-10-CM | POA: Insufficient documentation

## 2015-07-09 DIAGNOSIS — K469 Unspecified abdominal hernia without obstruction or gangrene: Secondary | ICD-10-CM | POA: Insufficient documentation

## 2015-07-09 DIAGNOSIS — E785 Hyperlipidemia, unspecified: Secondary | ICD-10-CM | POA: Insufficient documentation

## 2015-07-09 DIAGNOSIS — K219 Gastro-esophageal reflux disease without esophagitis: Secondary | ICD-10-CM | POA: Insufficient documentation

## 2015-07-09 DIAGNOSIS — R1310 Dysphagia, unspecified: Secondary | ICD-10-CM | POA: Insufficient documentation

## 2015-07-09 DIAGNOSIS — Z8249 Family history of ischemic heart disease and other diseases of the circulatory system: Secondary | ICD-10-CM | POA: Insufficient documentation

## 2015-07-09 DIAGNOSIS — J0110 Acute frontal sinusitis, unspecified: Secondary | ICD-10-CM | POA: Diagnosis not present
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
## 2015-07-09 NOTE — Patient Instructions (Addendum)
Try vitamin C (orange juice if not diabetic or vitamin C tablets) and drink green tea to help your immune system during your illness Get plenty of rest and hydration I would suggest that you stop the antibiotics after 5 days, since you are on the higher dose Please do eat yogurt daily or take a probiotic daily for the next month or so We want to replace the healthy germs in the gut If you notice foul, watery diarrhea in the next two months, schedule an appointment RIGHT AWAY

## 2015-07-09 NOTE — Telephone Encounter (Signed)
I see patient is on the schedule for TOMORROW; if he is having body aches, he needs to be seen TODAY; please work him in for acute visit and flu test

## 2015-07-10 ENCOUNTER — Ambulatory Visit: Payer: Medicare Other | Admitting: Family Medicine

## 2015-07-10 LAB — PLEASE NOTE:

## 2015-07-10 LAB — INFLUENZA A AND B
INFLUENZA B AG, EIA: NEGATIVE
Influenza A Ag, EIA: NEGATIVE

## 2015-07-15 DIAGNOSIS — H25043 Posterior subcapsular polar age-related cataract, bilateral: Secondary | ICD-10-CM | POA: Diagnosis not present

## 2015-08-11 DIAGNOSIS — L905 Scar conditions and fibrosis of skin: Secondary | ICD-10-CM | POA: Diagnosis not present

## 2015-08-11 DIAGNOSIS — L910 Hypertrophic scar: Secondary | ICD-10-CM | POA: Diagnosis not present

## 2015-09-22 ENCOUNTER — Ambulatory Visit: Payer: Medicare Other | Admitting: Family Medicine

## 2015-09-22 DIAGNOSIS — L720 Epidermal cyst: Secondary | ICD-10-CM | POA: Diagnosis not present

## 2015-09-22 DIAGNOSIS — L905 Scar conditions and fibrosis of skin: Secondary | ICD-10-CM | POA: Diagnosis not present

## 2015-09-23 ENCOUNTER — Ambulatory Visit: Payer: Medicare Other | Admitting: Family Medicine

## 2015-10-22 DIAGNOSIS — L905 Scar conditions and fibrosis of skin: Secondary | ICD-10-CM | POA: Diagnosis not present

## 2015-10-29 DIAGNOSIS — Z4801 Encounter for change or removal of surgical wound dressing: Secondary | ICD-10-CM | POA: Diagnosis not present

## 2015-10-29 DIAGNOSIS — S0100XD Unspecified open wound of scalp, subsequent encounter: Secondary | ICD-10-CM | POA: Diagnosis not present

## 2015-10-30 DIAGNOSIS — T814XXA Infection following a procedure, initial encounter: Secondary | ICD-10-CM | POA: Diagnosis not present

## 2015-11-06 DIAGNOSIS — L905 Scar conditions and fibrosis of skin: Secondary | ICD-10-CM | POA: Diagnosis not present

## 2015-12-25 DIAGNOSIS — R972 Elevated prostate specific antigen [PSA]: Secondary | ICD-10-CM | POA: Diagnosis not present

## 2015-12-25 DIAGNOSIS — N5201 Erectile dysfunction due to arterial insufficiency: Secondary | ICD-10-CM | POA: Diagnosis not present

## 2016-02-16 ENCOUNTER — Telehealth: Payer: Self-pay

## 2016-02-16 MED ORDER — CIPROFLOXACIN HCL 500 MG PO TABS: 500.0000 mg | ORAL_TABLET | Freq: Two times a day (BID) | ORAL | 0 refills | Status: AC

## 2016-02-16 MED ORDER — METRONIDAZOLE 500 MG PO TABS: 500.0000 mg | ORAL_TABLET | Freq: Three times a day (TID) | ORAL | 0 refills | Status: AC

## 2016-02-16 NOTE — Telephone Encounter (Signed)
I am away on vacation and unable to offer the patient an appointment [facility] -- In the future, please instruct patient to go to urgent care or ER when I am not immediately available to see a request like this (a request for antibiotics), or ask a colleague to cover an urgent need; thank you I will send in Rx now as requested, but it's 10:20 pm Please call him first thing Wednesday morning, let him know Rx sent in If his condition worsens, please urge him to go to urgent care or the ER Thank you

## 2016-02-16 NOTE — Telephone Encounter (Signed)
PT called States is having a diverticulitis flare wants to see if you will send him in a antibiotic.  He is having left side stomach pain states he knows what it is!

## 2016-02-18 ENCOUNTER — Telehealth: Payer: Self-pay

## 2016-02-18 NOTE — Telephone Encounter (Addendum)
I did urge pt to go to ER or urgent care but he insisted I ask you also.  I told him you were not in the office this week and did not know if or when you would look at your messages.  I am telling everyone this.  He states you have been taking care of him and wanted it to go through you.  He also states was upset with the [facility] because he saw them the other day before with several concerns this being one, and the doctor ask him several questions and asked him how he knew it was diverticulitis.  So he was made and wanted to ask you instead for an antibiotic.

## 2016-02-18 NOTE — Telephone Encounter (Signed)
Patient notified me that he did in fact go back to the [place] and was given meds.  The antibiotic you sent has been canceled.

## 2016-03-14 ENCOUNTER — Ambulatory Visit: Payer: Medicare Other | Admitting: Family Medicine

## 2016-03-29 ENCOUNTER — Ambulatory Visit: Payer: Medicare Other | Admitting: Family Medicine

## 2016-04-06 DIAGNOSIS — N5201 Erectile dysfunction due to arterial insufficiency: Secondary | ICD-10-CM | POA: Diagnosis not present

## 2016-04-06 DIAGNOSIS — R972 Elevated prostate specific antigen [PSA]: Secondary | ICD-10-CM | POA: Diagnosis not present

## 2016-06-16 ENCOUNTER — Ambulatory Visit: Payer: Medicare Other | Admitting: Family Medicine

## 2016-06-18 DIAGNOSIS — H2513 Age-related nuclear cataract, bilateral: Secondary | ICD-10-CM | POA: Diagnosis not present

## 2016-06-18 DIAGNOSIS — H16223 Keratoconjunctivitis sicca, not specified as Sjogren's, bilateral: Secondary | ICD-10-CM | POA: Diagnosis not present

## 2016-06-20 ENCOUNTER — Ambulatory Visit (INDEPENDENT_AMBULATORY_CARE_PROVIDER_SITE_OTHER): Payer: Medicare Other | Admitting: Family Medicine

## 2016-06-20 ENCOUNTER — Encounter: Payer: Self-pay | Admitting: Family Medicine

## 2016-06-20 DIAGNOSIS — J0110 Acute frontal sinusitis, unspecified: Secondary | ICD-10-CM

## 2016-06-20 DIAGNOSIS — R0982 Postnasal drip: Secondary | ICD-10-CM

## 2016-06-20 NOTE — Patient Instructions (Signed)
Get plenty of rest and hydration Please do eat yogurt daily or take a probiotic daily for the next month or two We want to replace the healthy germs in the gut If you notice foul, watery diarrhea in the next two months, schedule an appointment RIGHT AWAY

## 2016-06-20 NOTE — Assessment & Plan Note (Signed)
Patient appears to be improving clinically on the current antibiotics prescribed by the [place]; I do not know what those are; however, I did discuss risk of C diff with him and encouraged him to take steps to decrease risk, such as taking probiotic or eating yogurt daily
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
## 2016-06-20 NOTE — Assessment & Plan Note (Signed)
Continue antihistamine, finish out antibiotics

## 2016-07-07 DIAGNOSIS — R972 Elevated prostate specific antigen [PSA]: Secondary | ICD-10-CM | POA: Diagnosis not present

## 2016-07-11 ENCOUNTER — Ambulatory Visit: Payer: Medicare Other | Admitting: Family Medicine

## 2016-08-11 ENCOUNTER — Encounter: Payer: Self-pay | Admitting: Gastroenterology

## 2016-08-11 ENCOUNTER — Ambulatory Visit (INDEPENDENT_AMBULATORY_CARE_PROVIDER_SITE_OTHER): Payer: Medicare Other | Admitting: Gastroenterology

## 2016-08-11 VITALS — BP 145/77 | HR 64 | Temp 98.0°F | Ht 67.0 in | Wt 180.0 lb

## 2016-08-11 DIAGNOSIS — N5201 Erectile dysfunction due to arterial insufficiency: Secondary | ICD-10-CM | POA: Diagnosis not present

## 2016-08-11 DIAGNOSIS — R159 Full incontinence of feces: Secondary | ICD-10-CM | POA: Diagnosis not present

## 2016-08-11 DIAGNOSIS — R972 Elevated prostate specific antigen [PSA]: Secondary | ICD-10-CM | POA: Diagnosis not present

## 2016-08-11 NOTE — Progress Notes (Signed)
   Primary Care Physician: Enid Derry, MD  Primary Gastroenterologist:  Dr. Lucilla Lame  Chief Complaint  Patient presents with  . fecal leakage  . Encopresis    HPI: Jeffery Vasquez is a 71 y.o. male here due to anal leakage.  The patient was seen at the [facility] and sent to me for rectal manometry. The patient had a colonoscopy with multiple polyps removed at that time.  The total number of polyps was 6 and they were adenomatous.  The patient has been told that he should have a repeat colonoscopy 3 years after his last colonoscopy.  Current Outpatient Prescriptions  Medication Sig Dispense Refill  . clobetasol cream (TEMOVATE) 0.05 % Apply topically.    . fexofenadine (ALLEGRA) 180 MG tablet TAKE ONE PILL DAILY FOR ALLERGY SYMPTOMS.    Marland Kitchen Flunisolide (NASAREL NA) Place into the nose.    . sildenafil (REVATIO) 20 MG tablet 3-5 tablets daily as needed     No current facility-administered medications for this visit.     Allergies as of 08/11/2016 - Review Complete 08/11/2016  Allergen Reaction Noted  . Cephalexin Other (See Comments) 05/06/2013  . Cephalosporins Other (See Comments) 05/06/2013    ROS:  General: Negative for anorexia, weight loss, fever, chills, fatigue, weakness. ENT: Negative for hoarseness, difficulty swallowing , nasal congestion. CV: Negative for chest pain, angina, palpitations, dyspnea on exertion, peripheral edema.  Respiratory: Negative for dyspnea at rest, dyspnea on exertion, cough, sputum, wheezing.  GI: See history of present illness. GU:  Negative for dysuria, hematuria, urinary incontinence, urinary frequency, nocturnal urination.  Endo: Negative for unusual weight change.    Physical Examination:   BP (!) 145/77   Pulse 64   Temp 98 F (36.7 C) (Oral)   Ht 5\' 7"  (1.702 m)   Wt 180 lb (81.6 kg)   BMI 28.19 kg/m   General: Well-nourished, well-developed in no acute distress.  Eyes: No icterus. Conjunctivae pink. Extremities: No lower  extremity edema. No clubbing or deformities. Neuro: Alert and oriented x 3.  Grossly intact. Skin: Warm and dry, no jaundice.   Psych: Alert and cooperative, normal mood and affect.  Labs:    Imaging Studies: No results found.  Assessment and Plan:   Jeffery Vasquez is a 71 y.o. y/o male who has anal leakage and stool incontinence.  The patient was sent to me for anal manometry.  This is not something that we are equipped to do the patient has been sent to [facility] for the test to be done.  The patient has been explained the plan and agrees with it.    Lucilla Lame, MD. Marval Regal   Note: This dictation was prepared with Dragon dictation along with smaller phrase technology. Any transcriptional errors that result from this process are unintentional.

## 2016-08-12 ENCOUNTER — Other Ambulatory Visit: Payer: Self-pay

## 2016-08-12 ENCOUNTER — Telehealth: Payer: Self-pay

## 2016-08-12 DIAGNOSIS — R159 Full incontinence of feces: Secondary | ICD-10-CM

## 2016-08-12 NOTE — Progress Notes (Signed)
Patient contacted and given the date of the manometry. Written information mailed also. Procedure will be 08/24/16 at 12:30 pm

## 2016-08-12 NOTE — Telephone Encounter (Signed)
Ok to schedule for anal manometry, will get him in for office visit for follow up if needed after anal manometry

## 2016-08-12 NOTE — Telephone Encounter (Signed)
This patient is a referral from Dr Allen Norris. He is a [place] patient whom was sent to GI for an anal manometry. No records from the [place]. Should he be seen for an office visit or schedule him for the requested test?

## 2016-08-15 DIAGNOSIS — N5201 Erectile dysfunction due to arterial insufficiency: Secondary | ICD-10-CM | POA: Diagnosis not present

## 2016-08-16 ENCOUNTER — Ambulatory Visit (INDEPENDENT_AMBULATORY_CARE_PROVIDER_SITE_OTHER): Payer: Medicare Other | Admitting: General Surgery

## 2016-08-16 ENCOUNTER — Encounter: Payer: Self-pay | Admitting: General Surgery

## 2016-08-16 ENCOUNTER — Telehealth: Payer: Self-pay

## 2016-08-16 VITALS — BP 171/81 | HR 73 | Temp 98.0°F | Ht 67.0 in | Wt 177.2 lb

## 2016-08-16 DIAGNOSIS — K4090 Unilateral inguinal hernia, without obstruction or gangrene, not specified as recurrent: Secondary | ICD-10-CM | POA: Diagnosis not present

## 2016-08-16 NOTE — Telephone Encounter (Signed)
Laparoscopic Right Inguinal Hernia Repair with Dr. Adonis Huguenin on 08/31/16. No assist needed.  Surgery Information sent for OR and PAT scheduling.

## 2016-08-16 NOTE — Patient Instructions (Signed)
We have your surgery scheduled on 08/31/16  at [facility] with Dr. Adonis Huguenin. Please see your [facility] pre-care sheet for surgery information. Please call our office if you have any questions or concerns.
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
## 2016-08-17 NOTE — Telephone Encounter (Signed)
No authorization needed for CPT code [phone].

## 2016-08-17 NOTE — Telephone Encounter (Signed)
Patient has been advised of Surgery Date as well as Pre-Admission appointment date, time, and location.  Surgery Date: 08/31/16  Pre-admit Appointment: 08/25/16 at 1-5pm- Telephone  Patient has been advised to call [phone] the day before surgery between 1-3pm to obtain arrival time.

## 2016-08-18 ENCOUNTER — Ambulatory Visit: Payer: Medicare Other | Admitting: Gastroenterology

## 2016-08-22 ENCOUNTER — Other Ambulatory Visit (HOSPITAL_COMMUNITY): Payer: Self-pay | Admitting: Pharmacy Technician

## 2016-08-24 DIAGNOSIS — N5201 Erectile dysfunction due to arterial insufficiency: Secondary | ICD-10-CM | POA: Diagnosis not present

## 2016-08-25 ENCOUNTER — Other Ambulatory Visit: Payer: Self-pay | Admitting: *Deleted

## 2016-08-25 ENCOUNTER — Encounter: Payer: Self-pay | Admitting: *Deleted

## 2016-08-25 ENCOUNTER — Encounter
Admission: RE | Admit: 2016-08-25 | Discharge: 2016-08-25 | Disposition: A | Discharge status: Home / Self Care | Payer: Medicare Other | Source: Ambulatory Visit | Attending: General Surgery | Admitting: General Surgery

## 2016-08-25 NOTE — Patient Instructions (Signed)
  Your procedure is scheduled on: 08-31-16 [facility]) Report to Same Day Surgery 2nd floor medical mall [facility] Entrance-take elevator on left to 2nd floor.  Check in with surgery information desk.) To find out your arrival time please call [phone] between 1PM - 3PM on 08-30-16 (TUESDAY)  Remember: Instructions that are not followed completely may result in serious medical risk, up to and including death, or upon the discretion of your surgeon and anesthesiologist your surgery may need to be rescheduled.    _x___ 1. Do not eat food or drink liquids after midnight. No gum chewing or hard candies.     __x__ 2. No Alcohol for 24 hours before or after surgery.   __x__3. No Smoking for 24 prior to surgery.   ____  4. Bring all medications with you on the day of surgery if instructed.    __x__ 5. Notify your doctor if there is any change in your medical condition     (cold, fever, infections).     Do not wear jewelry, make-up, hairpins, clips or nail polish.  Do not wear lotions, powders, or perfumes. You may wear deodorant.  Do not shave 48 hours prior to surgery. Men may shave face and neck.  Do not bring valuables to the hospital.    [facility] is not responsible for any belongings or valuables.               Contacts, dentures or bridgework may not be worn into surgery.  Leave your suitcase in the car. After surgery it may be brought to your room.  For patients admitted to the hospital, discharge time is determined by your treatment team.   Patients discharged the day of surgery will not be allowed to drive home.  You will need someone to drive you home and stay with you the night of your procedure.    Please read over the following fact sheets that you were given:   [facility] Preparing for Surgery and or MRSA Information   ____ Take these medicines the morning of surgery with A SIP OF WATER:    1. NONE  2.  3.  4.  5.  6.  ____Fleets enema or Magnesium Citrate  as directed.   _x___ Use CHG Soap or sage wipes as directed on instruction sheet   ____ Use inhalers on the day of surgery and bring to hospital day of surgery  ____ Stop metformin 2 days prior to surgery    ____ Take 1/2 of usual insulin dose the night before surgery and none on the morning of  surgery.   ____ Stop Aspirin, Coumadin, Pllavix ,Eliquis, Effient, or Pradaxa  x__ Stop Anti-inflammatories such as Advil, Aleve, Ibuprofen, Motrin, Naproxen,          Naprosyn, Goodies powders or aspirin products NOW-Ok to take Tylenol.   ____ Stop supplements until after surgery.    ____ Bring C-Pap to the hospital.

## 2016-08-29 ENCOUNTER — Ambulatory Visit
Admission: RE | Admit: 2016-08-29 | Discharge: 2016-08-29 | Disposition: A | Discharge status: Home / Self Care | Payer: Medicare Other | Source: Ambulatory Visit | Attending: General Surgery | Admitting: General Surgery

## 2016-08-29 ENCOUNTER — Encounter
Admission: RE | Admit: 2016-08-29 | Discharge: 2016-08-29 | Disposition: A | Discharge status: Home / Self Care | Payer: Medicare Other | Source: Ambulatory Visit | Attending: General Surgery | Admitting: General Surgery

## 2016-08-29 DIAGNOSIS — R159 Full incontinence of feces: Secondary | ICD-10-CM | POA: Diagnosis not present

## 2016-08-29 DIAGNOSIS — R001 Bradycardia, unspecified: Secondary | ICD-10-CM | POA: Insufficient documentation

## 2016-08-29 DIAGNOSIS — Z01810 Encounter for preprocedural cardiovascular examination: Secondary | ICD-10-CM | POA: Diagnosis not present

## 2016-08-29 DIAGNOSIS — Z01812 Encounter for preprocedural laboratory examination: Secondary | ICD-10-CM | POA: Insufficient documentation

## 2016-08-29 DIAGNOSIS — I4510 Unspecified right bundle-branch block: Secondary | ICD-10-CM | POA: Insufficient documentation

## 2016-08-29 DIAGNOSIS — R918 Other nonspecific abnormal finding of lung field: Secondary | ICD-10-CM | POA: Insufficient documentation

## 2016-08-29 DIAGNOSIS — J984 Other disorders of lung: Secondary | ICD-10-CM | POA: Diagnosis not present

## 2016-08-29 LAB — BASIC METABOLIC PANEL
Anion gap: 7 (ref 5–15)
BUN: 23 mg/dL — ABNORMAL HIGH (ref 6–20)
CALCIUM: 8.8 mg/dL — AB (ref 8.9–10.3)
CO2: 28 mmol/L (ref 22–32)
CREATININE: 0.97 mg/dL (ref 0.61–1.24)
Chloride: 106 mmol/L (ref 101–111)
Glucose, Bld: 98 mg/dL (ref 65–99)
Potassium: 4.1 mmol/L (ref 3.5–5.1)
SODIUM: 141 mmol/L (ref 135–145)

## 2016-08-29 LAB — CBC WITH DIFFERENTIAL/PLATELET
BASOS ABS: 0 10*3/uL (ref 0–0.1)
BASOS PCT: 1 %
EOS ABS: 0.1 10*3/uL (ref 0–0.7)
Eosinophils Relative: 2 %
HCT: 42.7 % (ref 40.0–52.0)
Hemoglobin: 14.5 g/dL (ref 13.0–18.0)
Lymphocytes Relative: 22 %
Lymphs Abs: 1 10*3/uL (ref 1.0–3.6)
MCH: 29.3 pg (ref 26.0–34.0)
MCHC: 34 g/dL (ref 32.0–36.0)
MCV: 86.4 fL (ref 80.0–100.0)
MONOS PCT: 10 %
Monocytes Absolute: 0.4 10*3/uL (ref 0.2–1.0)
NEUTROS PCT: 65 %
Neutro Abs: 2.9 10*3/uL (ref 1.4–6.5)
Platelets: 175 10*3/uL (ref 150–440)
RBC: 4.94 MIL/uL (ref 4.40–5.90)
RDW: 16.3 % — ABNORMAL HIGH (ref 11.5–14.5)
WBC: 4.5 10*3/uL (ref 3.8–10.6)

## 2016-08-30 ENCOUNTER — Encounter: Payer: Self-pay | Admitting: *Deleted

## 2016-08-30 NOTE — Pre-Procedure Instructions (Signed)
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  DR Rosey Bath SAID NO CONSULT NEEDED

## 2016-08-31 ENCOUNTER — Encounter
Admission: RE | Disposition: A | Discharge status: Home / Self Care | Payer: Self-pay | Source: Ambulatory Visit | Attending: General Surgery

## 2016-08-31 ENCOUNTER — Ambulatory Visit: Payer: Medicare Other | Admitting: Registered Nurse

## 2016-08-31 ENCOUNTER — Ambulatory Visit
Admission: RE | Admit: 2016-08-31 | Discharge: 2016-08-31 | Disposition: A | Discharge status: Home / Self Care | Payer: Medicare Other | Source: Ambulatory Visit | Attending: General Surgery | Admitting: General Surgery

## 2016-08-31 ENCOUNTER — Encounter: Payer: Self-pay | Admitting: *Deleted

## 2016-08-31 DIAGNOSIS — N329 Bladder disorder, unspecified: Secondary | ICD-10-CM | POA: Insufficient documentation

## 2016-08-31 DIAGNOSIS — K5792 Diverticulitis of intestine, part unspecified, without perforation or abscess without bleeding: Secondary | ICD-10-CM | POA: Insufficient documentation

## 2016-08-31 DIAGNOSIS — R2681 Unsteadiness on feet: Secondary | ICD-10-CM | POA: Insufficient documentation

## 2016-08-31 DIAGNOSIS — H905 Unspecified sensorineural hearing loss: Secondary | ICD-10-CM | POA: Diagnosis not present

## 2016-08-31 DIAGNOSIS — Z8261 Family history of arthritis: Secondary | ICD-10-CM | POA: Insufficient documentation

## 2016-08-31 DIAGNOSIS — Z85828 Personal history of other malignant neoplasm of skin: Secondary | ICD-10-CM | POA: Diagnosis not present

## 2016-08-31 DIAGNOSIS — K219 Gastro-esophageal reflux disease without esophagitis: Secondary | ICD-10-CM | POA: Insufficient documentation

## 2016-08-31 DIAGNOSIS — Z8249 Family history of ischemic heart disease and other diseases of the circulatory system: Secondary | ICD-10-CM | POA: Insufficient documentation

## 2016-08-31 DIAGNOSIS — N486 Induration penis plastica: Secondary | ICD-10-CM | POA: Insufficient documentation

## 2016-08-31 DIAGNOSIS — R972 Elevated prostate specific antigen [PSA]: Secondary | ICD-10-CM | POA: Diagnosis not present

## 2016-08-31 DIAGNOSIS — E785 Hyperlipidemia, unspecified: Secondary | ICD-10-CM | POA: Diagnosis not present

## 2016-08-31 DIAGNOSIS — Z87442 Personal history of urinary calculi: Secondary | ICD-10-CM | POA: Diagnosis not present

## 2016-08-31 DIAGNOSIS — Z881 Allergy status to other antibiotic agents status: Secondary | ICD-10-CM | POA: Diagnosis not present

## 2016-08-31 DIAGNOSIS — Z86010 Personal history of colonic polyps: Secondary | ICD-10-CM | POA: Insufficient documentation

## 2016-08-31 DIAGNOSIS — H532 Diplopia: Secondary | ICD-10-CM | POA: Diagnosis not present

## 2016-08-31 DIAGNOSIS — Z833 Family history of diabetes mellitus: Secondary | ICD-10-CM | POA: Diagnosis not present

## 2016-08-31 DIAGNOSIS — D176 Benign lipomatous neoplasm of spermatic cord: Secondary | ICD-10-CM | POA: Insufficient documentation

## 2016-08-31 DIAGNOSIS — Z823 Family history of stroke: Secondary | ICD-10-CM | POA: Insufficient documentation

## 2016-08-31 DIAGNOSIS — K4030 Unilateral inguinal hernia, with obstruction, without gangrene, not specified as recurrent: Secondary | ICD-10-CM | POA: Insufficient documentation

## 2016-08-31 DIAGNOSIS — M5136 Other intervertebral disc degeneration, lumbar region: Secondary | ICD-10-CM | POA: Diagnosis not present

## 2016-08-31 DIAGNOSIS — K4090 Unilateral inguinal hernia, without obstruction or gangrene, not specified as recurrent: Secondary | ICD-10-CM | POA: Diagnosis not present

## 2016-08-31 HISTORY — PX: INGUINAL HERNIA REPAIR: SHX194

## 2016-08-31 SURGERY — REPAIR, HERNIA, INGUINAL, LAPAROSCOPIC
Anesthesia: General | Laterality: Right

## 2016-08-31 MED ORDER — BUPIVACAINE HCL (PF) 0.5 % IJ SOLN
INTRAMUSCULAR | Status: AC
  Filled 2016-08-31: qty 30

## 2016-08-31 MED ORDER — LACTATED RINGERS IV SOLN
INTRAVENOUS | Status: DC
  Administered 2016-08-31: 07:00:00 via INTRAVENOUS

## 2016-08-31 MED ORDER — PROPOFOL 10 MG/ML IV BOLUS
INTRAVENOUS | Status: DC | PRN
  Administered 2016-08-31: 200 mg via INTRAVENOUS

## 2016-08-31 MED ORDER — ONDANSETRON HCL 4 MG/2ML IJ SOLN: 4.0000 mg | Freq: Once | INTRAMUSCULAR | Status: DC | PRN

## 2016-08-31 MED ORDER — LIDOCAINE HCL (PF) 1 % IJ SOLN
INTRAMUSCULAR | Status: AC
  Filled 2016-08-31: qty 30

## 2016-08-31 MED ORDER — FAMOTIDINE 20 MG PO TABS
ORAL_TABLET | ORAL | Status: AC
  Administered 2016-08-31: 20 mg
  Filled 2016-08-31: qty 1

## 2016-08-31 MED ORDER — CHLORHEXIDINE GLUCONATE CLOTH 2 % EX PADS
6.0000 | MEDICATED_PAD | Freq: Once | CUTANEOUS | Status: AC
  Administered 2016-08-31: 6 via TOPICAL

## 2016-08-31 MED ORDER — CLINDAMYCIN PHOSPHATE 900 MG/50ML IV SOLN
INTRAVENOUS | Status: AC
  Administered 2016-08-31: 900 mg via INTRAVENOUS
  Filled 2016-08-31: qty 50

## 2016-08-31 MED ORDER — BUPIVACAINE HCL (PF) 0.5 % IJ SOLN
INTRAMUSCULAR | Status: DC | PRN
  Administered 2016-08-31: 15 mL

## 2016-08-31 MED ORDER — EPHEDRINE SULFATE 50 MG/ML IJ SOLN
INTRAMUSCULAR | Status: AC
  Filled 2016-08-31: qty 1

## 2016-08-31 MED ORDER — FENTANYL CITRATE (PF) 100 MCG/2ML IJ SOLN
INTRAMUSCULAR | Status: AC
  Filled 2016-08-31: qty 2

## 2016-08-31 MED ORDER — HYDROCODONE-ACETAMINOPHEN 5-325 MG PO TABS: 1.0000 | ORAL_TABLET | Freq: Four times a day (QID) | ORAL | 0 refills | Status: DC | PRN

## 2016-08-31 MED ORDER — SUGAMMADEX SODIUM 500 MG/5ML IV SOLN
INTRAVENOUS | Status: DC | PRN
  Administered 2016-08-31: 200 mg via INTRAVENOUS

## 2016-08-31 MED ORDER — CHLORHEXIDINE GLUCONATE CLOTH 2 % EX PADS: 6.0000 | MEDICATED_PAD | Freq: Once | CUTANEOUS | Status: DC

## 2016-08-31 MED ORDER — SEVOFLURANE IN SOLN
RESPIRATORY_TRACT | Status: AC
  Filled 2016-08-31: qty 250

## 2016-08-31 MED ORDER — LIDOCAINE HCL (CARDIAC) 20 MG/ML IV SOLN
INTRAVENOUS | Status: DC | PRN
  Administered 2016-08-31: 50 mg via INTRAVENOUS

## 2016-08-31 MED ORDER — MIDAZOLAM HCL 2 MG/2ML IJ SOLN
INTRAMUSCULAR | Status: AC
  Filled 2016-08-31: qty 2

## 2016-08-31 MED ORDER — KETOROLAC TROMETHAMINE 30 MG/ML IJ SOLN
INTRAMUSCULAR | Status: DC | PRN
  Administered 2016-08-31: 30 mg via INTRAVENOUS

## 2016-08-31 MED ORDER — MIDAZOLAM HCL 2 MG/2ML IJ SOLN
INTRAMUSCULAR | Status: DC | PRN
  Administered 2016-08-31: 2 mg via INTRAVENOUS

## 2016-08-31 MED ORDER — FENTANYL CITRATE (PF) 100 MCG/2ML IJ SOLN: 25.0000 ug | INTRAMUSCULAR | Status: DC | PRN

## 2016-08-31 MED ORDER — ACETAMINOPHEN 10 MG/ML IV SOLN
INTRAVENOUS | Status: DC | PRN
  Administered 2016-08-31: 1000 mg via INTRAVENOUS

## 2016-08-31 MED ORDER — PROPOFOL 10 MG/ML IV BOLUS
INTRAVENOUS | Status: AC
  Filled 2016-08-31: qty 20

## 2016-08-31 MED ORDER — ROCURONIUM BROMIDE 100 MG/10ML IV SOLN
INTRAVENOUS | Status: DC | PRN
  Administered 2016-08-31: 20 mg via INTRAVENOUS
  Administered 2016-08-31: 30 mg via INTRAVENOUS

## 2016-08-31 MED ORDER — FENTANYL CITRATE (PF) 100 MCG/2ML IJ SOLN
INTRAMUSCULAR | Status: DC | PRN
Start: 2016-08-31 — End: 2016-08-31
  Administered 2016-08-31 (×2): 50 ug via INTRAVENOUS

## 2016-08-31 MED ORDER — CLINDAMYCIN PHOSPHATE 900 MG/50ML IV SOLN
900.0000 mg | INTRAVENOUS | Status: AC
  Administered 2016-08-31: 900 mg via INTRAVENOUS

## 2016-08-31 MED ORDER — ONDANSETRON HCL 4 MG/2ML IJ SOLN
INTRAMUSCULAR | Status: DC | PRN
  Administered 2016-08-31: 4 mg via INTRAVENOUS

## 2016-08-31 MED ORDER — SUCCINYLCHOLINE CHLORIDE 20 MG/ML IJ SOLN
INTRAMUSCULAR | Status: DC | PRN
  Administered 2016-08-31: 100 mg via INTRAVENOUS

## 2016-08-31 MED ORDER — LIDOCAINE HCL (PF) 1 % IJ SOLN
INTRAMUSCULAR | Status: DC | PRN
  Administered 2016-08-31: 15 mL

## 2016-08-31 MED ORDER — LACTATED RINGERS IV SOLN
INTRAVENOUS | Status: DC | PRN
  Administered 2016-08-31 (×2): via INTRAVENOUS

## 2016-08-31 SURGICAL SUPPLY — 42 items
BLADE CLIPPER SURG (BLADE) ×4 IMPLANT
BLADE SURG SZ11 CARB STEEL (BLADE) ×2 IMPLANT
CANNULA DILATOR 10 W/SLV (CANNULA) ×2 IMPLANT
CATH TRAY 16F METER LATEX (MISCELLANEOUS) ×2 IMPLANT
CHLORAPREP W/TINT 26ML (MISCELLANEOUS) ×2 IMPLANT
DEVICE SECURE STRAP 25 ABSORB (INSTRUMENTS) ×2 IMPLANT
DISSECT BALLN SPACEMKR OVL PDB (BALLOONS)
DISSECT BALLN SPACEMKR RND PDB (MISCELLANEOUS)
DISSECTOR BALLN SPCMKR OVL PDB (BALLOONS) IMPLANT
DISSECTOR BALLN SPCMKR RND PDB (MISCELLANEOUS) IMPLANT
DISSECTOR KITTNER STICK (MISCELLANEOUS) IMPLANT
DISSECTORS/KITTNER STICK (MISCELLANEOUS)
GAUZE SPONGE NON-WVN 2X2 STRL (MISCELLANEOUS) ×1 IMPLANT
GLOVE BIO SURGEON STRL SZ7.5 (GLOVE) ×4 IMPLANT
GLOVE BIOGEL PI IND STRL 8 (GLOVE) ×2 IMPLANT
GLOVE BIOGEL PI INDICATOR 8 (GLOVE) ×2
GOWN STRL REUS W/ TWL LRG LVL3 (GOWN DISPOSABLE) ×2 IMPLANT
GOWN STRL REUS W/TWL LRG LVL3 (GOWN DISPOSABLE) ×2
IRRIGATION STRYKERFLOW (MISCELLANEOUS) IMPLANT
IRRIGATOR STRYKERFLOW (MISCELLANEOUS)
LABEL OR SOLS (LABEL) ×2 IMPLANT
LIQUID BAND (GAUZE/BANDAGES/DRESSINGS) ×2 IMPLANT
MESH PARIETEX 6X4IN RIGHT (Mesh General) ×2 IMPLANT
NDL SAFETY 22GX1.5 (NEEDLE) ×2 IMPLANT
NS IRRIG 500ML POUR BTL (IV SOLUTION) ×2 IMPLANT
PACK LAP CHOLECYSTECTOMY (MISCELLANEOUS) ×2 IMPLANT
PENCIL ELECTRO HAND CTR (MISCELLANEOUS) ×2 IMPLANT
SCISSORS METZENBAUM CVD 33 (INSTRUMENTS) IMPLANT
SLEEVE ENDOPATH XCEL 5M (ENDOMECHANICALS) ×2 IMPLANT
SPONGE LAP 18X18 5 PK (GAUZE/BANDAGES/DRESSINGS) IMPLANT
SPONGE VERSALON 2X2 STRL (MISCELLANEOUS) ×1
SURGILUBE 2OZ TUBE FLIPTOP (MISCELLANEOUS) IMPLANT
SUT ETHIBOND 0 (SUTURE) ×2 IMPLANT
SUT MNCRL 0 1X36 CT-1 (SUTURE) ×1 IMPLANT
SUT MNCRL 4-0 (SUTURE) ×1
SUT MNCRL 4-0 27XMFL (SUTURE) ×1
SUT MONOCRYL 0 (SUTURE) ×1
SUT VICRYL 0 AB UR-6 (SUTURE) ×2 IMPLANT
SUTURE MNCRL 4-0 27XMF (SUTURE) ×1 IMPLANT
TROCAR BALLN 10M OMST10SB SPAC (TROCAR) ×2 IMPLANT
TROCAR XCEL NON-BLD 5MMX100MML (ENDOMECHANICALS) ×2 IMPLANT
TUBING INSUFFLATOR HI FLOW (MISCELLANEOUS) ×2 IMPLANT

## 2016-08-31 NOTE — Brief Op Note (Signed)
08/31/2016  9:12 AM  PATIENT:  Jeffery Vasquez  71 y.o. male  PRE-OPERATIVE DIAGNOSIS:  right inguinal hernia  POST-OPERATIVE DIAGNOSIS:  right inguinal hernia  PROCEDURE:  Procedure(s): LAPAROSCOPIC INGUINAL HERNIA (Right)  SURGEON:  Surgeon(s) and Role:    * Clayburn Pert, MD - Primary  PHYSICIAN ASSISTANT:   ASSISTANTS: none   ANESTHESIA:   general  EBL:  Total I/O In: 1000 [I.V.:1000] Out: 425 [Urine:400; Blood:25]  BLOOD ADMINISTERED:none  DRAINS: none   LOCAL MEDICATIONS USED:  MARCAINE   , XYLOCAINE  and Amount: 30 ml  SPECIMEN:  No Specimen  DISPOSITION OF SPECIMEN:  N/A  COUNTS:  YES  TOURNIQUET:  * No tourniquets in log *  DICTATION: .Dragon Dictation  PLAN OF CARE: Discharge to home after PACU  PATIENT DISPOSITION:  PACU - hemodynamically stable.   Delay start of Pharmacological VTE agent (>24hrs) due to surgical blood loss or risk of bleeding: not applicable

## 2016-08-31 NOTE — Interval H&P Note (Signed)
History and Physical Interval Note:  08/31/2016 6:52 AM  Jeffery Vasquez  has presented today for surgery, with the diagnosis of right inguinal hernia  The various methods of treatment have been discussed with the patient and family. After consideration of risks, benefits and other options for treatment, the patient has consented to  Procedure(s): LAPAROSCOPIC INGUINAL HERNIA (Right) as a surgical intervention .  The patient's history has been reviewed, patient examined, no change in status, stable for surgery.  I have reviewed the patient's chart and labs.  Questions were answered to the patient's satisfaction.     Clayburn Pert
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
## 2016-08-31 NOTE — Anesthesia Post-op Follow-up Note (Cosign Needed)
Anesthesia QCDR form completed.        

## 2016-08-31 NOTE — Transfer of Care (Signed)
Immediate Anesthesia Transfer of Care Note  Patient: Jeffery Vasquez  Procedure(s) Performed: Procedure(s): LAPAROSCOPIC INGUINAL HERNIA (Right)  Patient Location: PACU  Anesthesia Type:General  Level of Consciousness: awake, alert  and oriented  Airway & Oxygen Therapy: Patient Spontanous Breathing and Patient connected to face mask oxygen  Post-op Assessment: Report given to RN  Post vital signs: Reviewed and stable  Last Vitals:  Vitals:   08/31/16 0915 08/31/16 0916  BP: (!) 146/85 (!) 151/87  Pulse: 79 80  Resp: 16 10  Temp: 36.1 C 36.4 C    Last Pain:  Vitals:   08/31/16 0616  TempSrc: Oral         Complications: No apparent anesthesia complications
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
## 2016-08-31 NOTE — Anesthesia Procedure Notes (Signed)
Procedure Name: Intubation Date/Time: 08/31/2016 7:45 AM Performed by: GHWEXHB, Kamarri Fischetti Pre-anesthesia Checklist: Timeout performed, Patient being monitored, Patient identified, Emergency Drugs available and Suction available Patient Re-evaluated:Patient Re-evaluated prior to inductionOxygen Delivery Method: Circle system utilized Preoxygenation: Pre-oxygenation with 100% oxygen Intubation Type: IV induction Ventilation: Mask ventilation without difficulty Laryngoscope Size: Mac and 3 Grade View: Grade I Tube type: Oral Tube size: 7.5 mm Number of attempts: 1 Secured at: 22 cm Tube secured with: Tape

## 2016-08-31 NOTE — Discharge Instructions (Signed)
Laparoscopic Inguinal Hernia Repair, Care After This sheet gives you information about how to care for yourself after your procedure. Your health care provider may also give you more specific instructions. If you have problems or questions, contact your health care provider. What can I expect after the procedure? After the procedure, it is common to have:  Pain, discomfort, or soreness.  Scrotal discoloration and swelling Follow these instructions at home: Incision care   Follow instructions from your health care provider about how to take care of your incision. Make sure you:  Wash your hands with soap and water before you change your bandage (dressing) or before you touch your abdomen. If soap and water are not available, use hand sanitizer.  Change your dressing as told by your health care provider.  Leave stitches (sutures), skin glue, or adhesive strips in place. These skin closures may need to stay in place for 2 weeks or longer. If adhesive strip edges start to loosen and curl up, you may trim the loose edges. Do not remove adhesive strips completely unless your health care provider tells you to do that.  Check your incision area every day for signs of infection. Check for:  Redness, swelling, or pain.  Fluid or blood.  Warmth.  Pus or a bad smell. Bathing   Do not take baths, swim, or use a hot tub until your health care provider approves. Ask your health care provider if you can take showers. OK to shower in 24 hours.  Keep your bandage (dressing) dry until your health care provider says it can be removed. Activity   Do not lift anything that is heavier than 10 lb (4.5 kg) until your health care provider approves.  Do not drive or use heavy machinery while taking prescription pain medicine. Ask your health care provider when it is safe for you to drive or use heavy machinery.  Do not drive for 24 hours if you were given a medicine to help you relax (sedative) during  your procedure.  Rest as told by your health care provider. You may return to your normal activities when your health care provider approves. General instructions   Take over-the-counter and prescription medicines only as told by your health care provider.  To prevent or treat constipation while you are taking prescription pain medicine, your health care provider may recommend that you:  Take over-the-counter or prescription medicines.  Eat foods that are high in fiber, such as fresh fruits and vegetables, whole grains, and beans.  Limit foods that are high in fat and processed sugars, such as fried and sweet foods.  Drink enough fluid to keep your urine clear or pale yellow.  Hold a pillow over your abdomen when you cough or sneeze. This helps with pain.  Keep all follow-up visits as told by your health care provider. This is important. Contact a health care provider if:  You have:  A fever or chills.  Redness, swelling, or pain around your incision.  Fluid or blood coming from your incision.  Pus or a bad smell coming from your incision.  Pain that gets worse or does not get better with medicine.  Nausea or vomiting.  A cough.  Shortness of breath.  Your incision feels warm to the touch.  You have not had a bowel movement in three days.  You are not able to urinate. Get help right away if:  You have severe pain in your abdomen.  You have persistent nausea and vomiting.  You have redness, warmth, or pain in your leg.  You have chest pain.  You have trouble breathing. Summary  After this procedure, it is common to have pain, discomfort, or soreness.  Follow instructions from your health care provider about how to take care of your incision.  Check your incision area every day for signs of infection. Report any signs of infection to your health care provider.  Keep all follow-up visits as told by your health care provider. This is important. This  information is not intended to replace advice given to you by your health care provider. Make sure you discuss any questions you have with your health care provider. Document Released: 06/06/2012 Document Revised: 02/10/2016 Document Reviewed: 02/10/2016 Elsevier Interactive Patient Education  2017 [place]   1) The drugs that you were given will stay in your system until tomorrow so for the next 24 hours you should not:  A) Drive an automobile B) Make any legal decisions C) Drink any alcoholic beverage   2) You may resume regular meals tomorrow.  Today it is better to start with liquids and gradually work up to solid foods.  You may eat anything you prefer, but it is better to start with liquids, then soup and crackers, and gradually work up to solid foods.   3) Please notify your doctor immediately if you have any unusual bleeding, trouble breathing, redness and pain at the surgery site, drainage, fever, or pain not relieved by medication.    4) Additional Instructions:        Please contact your physician with any problems or Same Day Surgery at [phone], Monday through Friday 6 am to 4 pm, or [place] at [facility] number at [phone].

## 2016-08-31 NOTE — Op Note (Addendum)
Pre-operative Diagnosis: Right inguinal hernia  Post-operative Diagnosis: Right Direct Inguinal Hernia  Procedure: Laparoscopic Right Inguinal Hernia repair.  Surgeon: Clayburn Pert   Assistants: None  Anesthesia: General endotracheal anesthesia  ASA Class: 2  Surgeon: Clayburn Pert, MD FACS  Anesthesia: Gen. with endotracheal tube  Assistant:None  Procedure Details  The patient was seen again in the Holding Room. The benefits, complications, treatment options, and expected outcomes were discussed with the patient. The risks of bleeding, infection, recurrence of symptoms, failure to resolve symptoms,  bowel injury, any of which could require further surgery were reviewed with the patient.   The patient was taken to Operating Room, identified as Jeffery Vasquez and the procedure verified.  A Time Out was held and the above information confirmed.  Prior to the induction of general anesthesia, antibiotic prophylaxis was administered. VTE prophylaxis was in place. General endotracheal anesthesia was then administered and tolerated well. A Foley catheter was placed by the nursing staff. After the induction, the abdomen was prepped with Chloraprep and draped in the sterile fashion. The patient was positioned in the supine position.  An infra-umbilical incision was made just to the right of midline with a 15 blade scalpel after localization with a 50-50 mixture 1% lidocaine, partial Marcaine plain. Using Bovie much cautery was taken of the level of the anterior rectus fascia. The fascia was entered into sharply allowing visualization of the rectus muscle towards the midline raphae. The muscle was then laterally distracted with a Kelly forceps and a dissecting balloon was palpated going down to the pubic tubercle. Under direct visualization the dissecting balloon was inflated and held inflated for 2 minutes to create our operative space.  After 2 minutes the dissecting balloon was deflated  and removed and an operative balloon trocar was placed through this incision. Pneumo preperitoneal space was created to 15 mmHg which allowed visualization of the Cooper's ligament and the lateral preperitoneal space for our operation. 2 midline 5 mm trochars were placed in the lower midline position. This allowed blunt dissection for complete visualization of Cooper's ligament as well as the inferior epigastric vessels and spermatic cord. A cord lipoma was noted going into the indirect space. There was however noted to be a large direct defect that was fat containing. All these were retracted into the preperitoneal space allowing full visualization for this direct inguinal hernia.  A right sided parietex anatomical mesh was cut to the appropriate size folded and held in place with a 0 Monocryl suture. This was placed in the pre-peritoneal space where the suture was removed along the mesh to be unfolded covering both the direct and indirect space with ease. It was secured to Cooper's ligament and the anterior abdominal wall and the lateral space with secure strap tacks taking care to avoid any vascular or neurovascular bundles. The mesh was visualized as the pneumo was released with good coverage of our spaces without any difficulty.  Our trochars were removed under direct position. The rectus fascia was closed with interrupted 2-0 Vicryl suture. All the sites for re-localized before mentioned local anesthetic. The skin was closed with a running septic with 4-0 Monocryl. These were then sealed with Dermabond. The patient was awoken from general tracheal anesthesia intra-to the PACU in good condition. All counts are corrected and the procedure were no immediate, complications.  Findings: Direct right inguinal hernia   Estimated Blood Loss: 10 mL         Drains: None  Specimens: None          Complications: None                  Condition: Good   Clayburn Pert, MD, FACS

## 2016-08-31 NOTE — Anesthesia Postprocedure Evaluation (Signed)
Anesthesia Post Note  Patient: Jeffery Vasquez  Procedure(s) Performed: Procedure(s) (LRB): LAPAROSCOPIC INGUINAL HERNIA (Right)  Patient location during evaluation: PACU Anesthesia Type: General Level of consciousness: awake and alert Pain management: pain level controlled Vital Signs Assessment: post-procedure vital signs reviewed and stable Respiratory status: spontaneous breathing, nonlabored ventilation, respiratory function stable and patient connected to nasal cannula oxygen Cardiovascular status: blood pressure returned to baseline and stable Postop Assessment: no signs of nausea or vomiting Anesthetic complications: no     Last Vitals:  Vitals:   08/31/16 1030 08/31/16 1051  BP: (!) 142/66 (!) 154/69  Pulse: (!) 52 (!) 53  Resp: 12 14  Temp:  (!) 36.1 C    Last Pain:  Vitals:   08/31/16 1051  TempSrc: Temporal  PainSc: 1                  Martha Clan

## 2016-09-05 ENCOUNTER — Encounter: Payer: Self-pay | Admitting: General Surgery

## 2016-09-06 ENCOUNTER — Ambulatory Visit (INDEPENDENT_AMBULATORY_CARE_PROVIDER_SITE_OTHER): Payer: Medicare Other | Admitting: Surgery

## 2016-09-06 ENCOUNTER — Encounter: Payer: Self-pay | Admitting: Surgery

## 2016-09-06 VITALS — BP 176/85 | HR 59 | Temp 98.0°F | Ht 67.0 in | Wt 177.6 lb

## 2016-09-06 DIAGNOSIS — R911 Solitary pulmonary nodule: Secondary | ICD-10-CM

## 2016-09-06 DIAGNOSIS — R9389 Abnormal findings on diagnostic imaging of other specified body structures: Secondary | ICD-10-CM

## 2016-09-06 DIAGNOSIS — R938 Abnormal findings on diagnostic imaging of other specified body structures: Secondary | ICD-10-CM

## 2016-09-06 NOTE — Patient Instructions (Addendum)
You will need to have a CT Scan of your Chest to follow-up on your abnormal Chest X-ray prior to surgery. We have scheduled this for 09/13/16 at 1445 at the [facility].  The outpatient imaging center is off of [employer]. The address to this location is: [address], [place], [place], [place] [address]. The number to this location in case you are lost is, [phone].  Bring a list of medications with you to your appointment. Nothing to eat or drink 6 hours prior to your CT Scan.  If you need to reschedule your Scan, you may do so by calling [phone].    In regards to your Hernia, if you have any questions or concerns please call our office at anytime and speak with a nurse.

## 2016-09-06 NOTE — Progress Notes (Signed)
HPI: 71 year old status post laparoscopic inguinal hernia repair by Dr. Adonis Huguenin. He is doing well and only complains of mild intermittent right flank pain. No fevers, no chills. He is taking by mouth and passing gas. Of note on the preoperative chest x-ray today was a patchy density in the right upper lobe and. I have personally reviewed the chest x-ray and actually I have compared this with a CT of the abdomen and pelvis that he had in 2016. At that time he did not have this as she density. And this raises the concern for a potential malignancy. Of note he is asymptomatic from a pulmonary perspective denies any hemoptysis or any weight loss.  ROS: full ROS performed and is otherwise negative  PE NAD, awake alert Chest : CTA, no chest wall masses Abd: soft, NT, incisions healing well, no infection, no recurrence Ext: no edema, well perfused  A/P doing very well after Her scopic inguinal hernia. Normal abdominal discomfort after laparoscopic repair. More importantly he does have a patchy density in the right upper lobe that needs further workup. Discussed with the patient detail he is a nonsmoker and but apparently he did have a history of pneumothorax and rib fracture about 20 years ago written that required a chest tube. Of note it is interesting that this patchy density was not present on a CT scan of the abdomen that was done less than 2 years ago. And discussed with him in detail about my findings and we'll go ahead and schedule him for a CT scan of the chest to rule out any primary pulmonary malignancy.

## 2016-09-07 ENCOUNTER — Telehealth: Payer: Self-pay

## 2016-09-07 NOTE — Telephone Encounter (Signed)
No authorization needed for CPT code [phone],  Spoke with Crystal C. @ [employer] part A and B no authorization needed.  [employer] -No authorization needed CPT 71260 per phone prompts.

## 2016-09-12 ENCOUNTER — Ambulatory Visit
Admission: RE | Admit: 2016-09-12 | Discharge: 2016-09-12 | Disposition: A | Discharge status: Home / Self Care | Payer: Medicare Other | Source: Ambulatory Visit | Attending: Surgery | Admitting: Surgery

## 2016-09-12 DIAGNOSIS — R911 Solitary pulmonary nodule: Secondary | ICD-10-CM | POA: Diagnosis not present

## 2016-09-12 DIAGNOSIS — J479 Bronchiectasis, uncomplicated: Secondary | ICD-10-CM | POA: Insufficient documentation

## 2016-09-12 DIAGNOSIS — R918 Other nonspecific abnormal finding of lung field: Secondary | ICD-10-CM | POA: Insufficient documentation

## 2016-09-12 DIAGNOSIS — R938 Abnormal findings on diagnostic imaging of other specified body structures: Secondary | ICD-10-CM | POA: Insufficient documentation

## 2016-09-12 DIAGNOSIS — R9389 Abnormal findings on diagnostic imaging of other specified body structures: Secondary | ICD-10-CM

## 2016-09-12 MED ORDER — IOPAMIDOL (ISOVUE-300) INJECTION 61%
75.0000 mL | Freq: Once | INTRAVENOUS | Status: AC | PRN
  Administered 2016-09-12: 75 mL via INTRAVENOUS

## 2016-09-13 ENCOUNTER — Ambulatory Visit: Payer: Medicare Other

## 2016-09-16 ENCOUNTER — Telehealth: Payer: Self-pay

## 2016-09-16 NOTE — Telephone Encounter (Signed)
Patient wants to know the results of his CT Scan results done on 09/12/16. Please call patient and advice.

## 2016-09-16 NOTE — Telephone Encounter (Signed)
Returned phone call to patient at this time. I explained that this CT Scan is under review by Physician at this time and as soon as results are reported to me, I will call him with results. He is not happy to hear this but is ok with me calling him on Monday in regards to this.  Will return phone call to patient on Monday.

## 2016-09-19 NOTE — Telephone Encounter (Addendum)
Spoke with Dr. Adonis Huguenin at this time. He would like patient to see Dr. Genevive Bi as soon as possible to review CT and decide if any further testing is needed.  Returned phone call to patient. Reviewed results. Appointment made with Dr. Genevive Bi 09/23/16. Patient verbalizes understanding.

## 2016-09-23 ENCOUNTER — Encounter: Payer: Self-pay | Admitting: Cardiothoracic Surgery

## 2016-09-23 ENCOUNTER — Ambulatory Visit (INDEPENDENT_AMBULATORY_CARE_PROVIDER_SITE_OTHER): Payer: Medicare Other | Admitting: Cardiothoracic Surgery

## 2016-09-23 VITALS — BP 162/80 | HR 58 | Temp 98.3°F | Ht 67.0 in | Wt 178.0 lb

## 2016-09-23 DIAGNOSIS — R911 Solitary pulmonary nodule: Secondary | ICD-10-CM
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
## 2016-09-23 NOTE — Addendum Note (Signed)
Addended by: Wayna Chalet on: 09/23/2016 02:54 PM   Modules accepted: Orders

## 2016-09-23 NOTE — Patient Instructions (Addendum)
We will order your CT Scan in 4 weeks. We will see you back after your CT Scan to go over your results.  Go to the [place] and ask if they could give you a copy of any CT Scans or chest X-Ray.

## 2016-10-10 ENCOUNTER — Ambulatory Visit (HOSPITAL_COMMUNITY)
Admission: RE | Admit: 2016-10-10 | Discharge: 2016-10-10 | Disposition: A | Discharge status: Home / Self Care | Payer: Non-veteran care | Source: Ambulatory Visit | Attending: Gastroenterology | Admitting: Gastroenterology

## 2016-10-10 ENCOUNTER — Encounter (HOSPITAL_COMMUNITY)
Admission: RE | Disposition: A | Discharge status: Home / Self Care | Payer: Self-pay | Source: Ambulatory Visit | Attending: Gastroenterology

## 2016-10-10 DIAGNOSIS — R159 Full incontinence of feces: Secondary | ICD-10-CM | POA: Insufficient documentation

## 2016-10-10 HISTORY — PX: ANAL RECTAL MANOMETRY: SHX6358

## 2016-10-10 SURGERY — MANOMETRY, ANORECTAL
Anesthesia: Choice

## 2016-10-10 NOTE — Progress Notes (Signed)
Anal Rectal mano done per protocol.  Pt tolerated well, without complication.  Dr. Silverio Decamp to be emailed today.  Laverta [place], RN

## 2016-10-11 ENCOUNTER — Encounter (HOSPITAL_COMMUNITY): Payer: Self-pay | Admitting: Gastroenterology

## 2016-10-12 DIAGNOSIS — R159 Full incontinence of feces: Secondary | ICD-10-CM

## 2016-10-18 ENCOUNTER — Other Ambulatory Visit: Payer: Self-pay

## 2016-10-20 ENCOUNTER — Ambulatory Visit
Admission: RE | Admit: 2016-10-20 | Discharge: 2016-10-20 | Disposition: A | Discharge status: Home / Self Care | Payer: Medicare Other | Source: Ambulatory Visit | Attending: Cardiothoracic Surgery | Admitting: Cardiothoracic Surgery

## 2016-10-20 DIAGNOSIS — R911 Solitary pulmonary nodule: Secondary | ICD-10-CM

## 2016-10-20 DIAGNOSIS — R918 Other nonspecific abnormal finding of lung field: Secondary | ICD-10-CM | POA: Diagnosis not present

## 2016-10-21 ENCOUNTER — Ambulatory Visit (INDEPENDENT_AMBULATORY_CARE_PROVIDER_SITE_OTHER): Payer: Medicare Other | Admitting: Cardiothoracic Surgery

## 2016-10-21 ENCOUNTER — Encounter: Payer: Self-pay | Admitting: Cardiothoracic Surgery

## 2016-10-21 VITALS — BP 143/81 | HR 55 | Temp 98.0°F | Wt 176.0 lb

## 2016-10-21 DIAGNOSIS — R911 Solitary pulmonary nodule: Secondary | ICD-10-CM

## 2016-10-21 DIAGNOSIS — R918 Other nonspecific abnormal finding of lung field: Secondary | ICD-10-CM

## 2016-10-21 NOTE — Patient Instructions (Addendum)
We will see you back in 6 months to make sure that everything is good. I will be calling you to set up your appointments.

## 2016-10-21 NOTE — Progress Notes (Signed)
  Patient ID: Jeffery Vasquez, male   DOB: July 28, 1945, 70 y.o.   MRN: [id number]  HISTORY: [name] returns today in follow-up. He has no respiratory complaints. He was [place] hunting and walked at least 10 miles without any limitations. He states he feels well overall without fevers or chills.   Vitals:   10/21/16 0755  BP: (!) 143/81  Pulse: (!) 55  Temp: 98 F (36.7 C)     EXAM:    Resp: Lungs are clear bilaterally.  No respiratory distress, normal effort. Heart:  Regular without murmurs Abd:  Abdomen is soft, non distended and non tender. No masses are palpable.  There is no rebound and no guarding.  Neurological: Alert and oriented to person, place, and time. Coordination normal.  Skin: Skin is warm and dry. No rash noted. No diaphoretic. No erythema. No pallor.  Psychiatric: Normal mood and affect. Normal behavior. Judgment and thought content normal.    ASSESSMENT: He did have a chest CT made yesterday.  I have independently reviewed that in compared it to his prior CT. The right middle lobe nodule has now resolved. There is a new cluster of nodules in the left upper lobe suggesting an inflammatory process. There are no suspicious nodules to suggest any lung cancer.   PLAN:   I told [name] like to see him back again in 6 months time with another CT of the chest. If these nodules and parenchymal infiltrates persist I will make a recommendation I see one of our pulmonologist. He is a lifelong nonsmoker and has no other risk factors for malignancy. He is agreeable to this plan.    Nestor Lewandowsky, MD

## 2016-10-26 ENCOUNTER — Telehealth: Payer: Self-pay | Admitting: Gastroenterology

## 2016-10-26 NOTE — Telephone Encounter (Signed)
Beth RN has left for the day. Will call her again tomorrow.

## 2016-10-27 NOTE — Telephone Encounter (Signed)
Left v/m for Beth RN. No prior authorization has been started that I know of. I faxed the order to the fax number on his initial form authorizing visits to [place]. If she knows of a way to do this, that would be helpful.

## 2016-10-28 NOTE — Telephone Encounter (Signed)
Call back received from referring GI. "Tye [place]" reports the referral has been received by the [place]. They will take it from there by reviewing the recommendations and decide to whom the patient will be referred. They will contact the [facility] themselves.

## 2016-11-02 ENCOUNTER — Ambulatory Visit: Payer: Medicare Other | Admitting: Physical Therapy

## 2016-11-17 DIAGNOSIS — N5201 Erectile dysfunction due to arterial insufficiency: Secondary | ICD-10-CM | POA: Diagnosis not present

## 2016-12-05 DIAGNOSIS — L821 Other seborrheic keratosis: Secondary | ICD-10-CM | POA: Diagnosis not present

## 2016-12-29 ENCOUNTER — Other Ambulatory Visit: Payer: Self-pay | Admitting: Urology

## 2016-12-30 NOTE — Progress Notes (Signed)
02/26/201/- noted EKG in EPIC and CXR 09/12/2016- noted in EPIC- Chest CT with contrast 04/202018-noted in EPIC- CT chest without contrast

## 2017-01-02 ENCOUNTER — Encounter (HOSPITAL_COMMUNITY)
Admission: RE | Admit: 2017-01-02 | Discharge: 2017-01-02 | Disposition: A | Discharge status: Home / Self Care | Payer: Medicare Other | Source: Ambulatory Visit | Attending: Urology | Admitting: Urology

## 2017-01-02 ENCOUNTER — Encounter (HOSPITAL_COMMUNITY): Payer: Self-pay

## 2017-01-02 DIAGNOSIS — N529 Male erectile dysfunction, unspecified: Secondary | ICD-10-CM | POA: Diagnosis not present

## 2017-01-02 DIAGNOSIS — Z01812 Encounter for preprocedural laboratory examination: Secondary | ICD-10-CM | POA: Insufficient documentation

## 2017-01-02 LAB — CBC
HCT: 43.3 % (ref 39.0–52.0)
HEMOGLOBIN: 14.9 g/dL (ref 13.0–17.0)
MCH: 31 pg (ref 26.0–34.0)
MCHC: 34.4 g/dL (ref 30.0–36.0)
MCV: 90 fL (ref 78.0–100.0)
PLATELETS: 144 10*3/uL — AB (ref 150–400)
RBC: 4.81 MIL/uL (ref 4.22–5.81)
RDW: 14.2 % (ref 11.5–15.5)
WBC: 5 10*3/uL (ref 4.0–10.5)

## 2017-01-02 NOTE — Patient Instructions (Signed)
Jeffery Vasquez  01/02/2017   Your procedure is scheduled on: 01-06-17  Report to [facility] Main  Entrance Take [place]  elevators to 3rd floor to  [place] at 630 AM.  Call this number if you have problems the morning of surgery [phone]   Remember: ONLY 1 PERSON MAY GO WITH YOU TO SHORT STAY TO GET  READY MORNING OF [place].  Do not eat food or drink liquids :After Midnight.     Take these medicines the morning of surgery with A SIP OF WATER: FLONASE NASAL SPRAY              You may not have any metal on your body including hair pins and              piercings  Do not wear jewelry, make-up, lotions, powders or perfumes, deodorant             Do not wear nail polish.  Do not shave  48 hours prior to surgery.              Men may shave face and neck.   Do not bring valuables to the hospital. [place].  Contacts, dentures or bridgework may not be worn into surgery.  Leave suitcase in the car. After surgery it may be brought to your room.                 Please read over the following fact sheets you were given: _____________________________________________________________________             [facility] - Preparing for Surgery Before surgery, you can play an important role.  Because skin is not sterile, your skin needs to be as free of germs as possible.  You can reduce the number of germs on your skin by washing with CHG (chlorahexidine gluconate) soap before surgery.  CHG is an antiseptic cleaner which kills germs and bonds with the skin to continue killing germs even after washing. Please DO NOT use if you have an allergy to CHG or antibacterial soaps.  If your skin becomes reddened/irritated stop using the CHG and inform your nurse when you arrive at Short Stay. Do not shave (including legs and underarms) for at least 48 hours prior to the first CHG shower.  You may shave your  face/neck. Please follow these instructions carefully:  1.  Shower with CHG Soap the night before surgery and the  morning of Surgery.  2.  If you choose to wash your hair, wash your hair first as usual with your  normal  shampoo.  3.  After you shampoo, rinse your hair and body thoroughly to remove the  shampoo.                           4.  Use CHG as you would any other liquid soap.  You can apply chg directly  to the skin and wash                       Gently with a scrungie or clean washcloth.  5.  Apply the CHG Soap to your body ONLY FROM THE NECK DOWN.   Do not use on face/  open                           Wound or open sores. Avoid contact with eyes, ears mouth and genitals (private parts).                       Wash face,  Genitals (private parts) with your normal soap.             6.  Wash thoroughly, paying special attention to the area where your surgery  will be performed.  7.  Thoroughly rinse your body with warm water from the neck down.  8.  DO NOT shower/wash with your normal soap after using and rinsing off  the CHG Soap.                9.  Pat yourself dry with a clean towel.            10.  Wear clean pajamas.            11.  Place clean sheets on your bed the night of your first shower and do not  sleep with pets. Day of Surgery : Do not apply any lotions/deodorants the morning of surgery.  Please wear clean clothes to the hospital/surgery center.  FAILURE TO FOLLOW THESE INSTRUCTIONS MAY RESULT IN THE CANCELLATION OF YOUR SURGERY PATIENT SIGNATURE_________________________________  NURSE SIGNATURE__________________________________  ________________________________________________________________________

## 2017-01-05 MED ORDER — GENTAMICIN SULFATE 40 MG/ML IJ SOLN
5.0000 mg/kg | INTRAVENOUS | Status: AC
  Administered 2017-01-06: 400 mg via INTRAVENOUS
  Filled 2017-01-05: qty 10

## 2017-01-05 NOTE — H&P (Signed)
HPI: Jeffery Vasquez is a 71 year-old male with erectile dysfunction who presents for inflatable penile prosthesis implantation today.  He does have problems with erectile dysfunction. His symptoms did not begin gradually. He does have difficulties achieving an erection. He does have trouble maintaining his erection. His libido has decreased.   He has tried Viagra, Cialis, MUSE, and Trimix for his erectile dysfunction. His medication did not allow him to achieve satisfactory erections.   He has not tried a vacuum erection device.   12/25/15: He told me that his erectile dysfunction began after he had his plaque incision and patch grafting. He said he tried Cialis at a 10 mg dose and found that it was not effective. He also has tried sildenafil at a 100 mg dose and did not find that this was effective. He tried Cialis 20 mg and also found this to be ineffective.  He told me he has a vacuum erection device but has never tried using it.   Interval history 08/31/16: He reports he tried the MUSE intraurethral suppositories but found these to be ineffective. He said he did get a partial erection not complete erection and wanted to discuss other options for treatment.   Interval history 08/15/16: He returns today for intracavernosal test injection.   Interval history 08/24/16: He came in today because he had developed significant ecchymoses after his intracavernosal test injection. He said he developed internal bleeding. He had no pain. It did involve the entire shaft of the penis but did not extend into the scrotum. It has nearly resolved now. He does not take any aspirin or other medication that could cause thinning of the blood.   Interval history 11/17/16: He told me that since he had difficulty with penile ecchymoses and also due to the fact that he had difficulty with slow healing after his patch graft and plaque incision in the past he is unable to give himself self injections. He wanted to discuss penile  prosthesis implantation.     ALLERGIES: Cephalexin CAPS Cephalosporins    MEDICATIONS: Muse 1,000 mcg suppository, urethral 1 cartridge Per Urethra PRN  Triple Mix PGE1 40 Micrograms, Papaverine 30 mg, phentolamine 1 mg and lidocaine 10 mg/mL Dispense 10 cc  Colon Health     GU PSH: Cysto Bladder Stone <2.5cm - 2014 Cysto Bladder Ureth Biopsy - 2014 Laser Surgery Prostate - 2014 Penile Injection - 08/15/2016 Peyronies Grafting - 2014      PSH Notes: Cystoscopy With Biopsy, Surgery Penis Excision Of Penile Plaque W/ Graft To 5 Cm, Craniotomy Excision Of Acoustic Neuroma, Laser Coagulation Of Prostate, Cystoscopy With Fragmentation Of Bladder Calculus, Maxillary Sinus Endoscopy (Therapeutic), Tonsillectomy, Nasal Septal Deviation Repair, Varicose Vein Ligation, Endoscopy - Papillotomy, Colonoscopy (Fiberoptic)   NON-GU PSH: Diagnostic Colonoscopy - 2014 Endo Cholangiopancreatograph - 2014 Ligate/divide/excise Vein - 2014 Remove Tonsils - 2014    GU PMH: ED due to arterial insufficiency (Stable), I had a long discussion with him today about the fact that he did not have "internal bleeding" as much as he did have some bleeding underneath the skin which tracked beneath the skin and caused discoloration of the penis which has nearly resolved. I told him that there would be no long-term effects. We then discussed several methods of injection in order to help prevent this in the future. I told him he should try to avoid any obvious superficial vascular structures on the skin and also to inject into the corpus cavernosum at a 45 angle. Finally I recommended  applying pressure to the injection site for 1 minute and this should eliminate this problem in the future. He was placed into this. - 08/24/2016, (Stable), It appears this is going to be effective for him. He will return to see me as previously scheduled for follow-up of his elevated PSA., - 08/15/2016 (Worsening), He continues to have difficulty  with erectile dysfunction and failed a trial of Muse intraurethral suppositories. I therefore have discussed using intracavernosal injections and he would like to proceed with this. He will be prescribed the medication and return for an intracavernosal test injection., - 08/11/2016 (Worsening), We discussed intraurethral suppositories and intracavernosal injections as other options for treatment. He wanted to try the intraurethral suppositories., - 04/06/2016 (Worsening, Chronic), He has fairly significant erectile dysfunction. We discussed the treatment options today., - 12/25/2015 Elevated PSA (Stable), He has a history of PSA elevation with a free to total ratio that would suggest a benign etiology however I had seen some variation in his PSA and therefore my recommendation has been to continue to follow this with a repeat PSA again in 6 months and then again in 6 months following that with DRE again at that time. - 08/11/2016, (Worsening), He has had a significant increase in his PSA. No abnormality was noted on DRE. I have recommended repeating the PSA and if it remains elevated to proceed with a prostate biopsy., - 04/06/2016 (Stable), His prostate was noted to be benign on exam. I will check a PSA today., - 12/25/2015, Elevated prostate specific antigen (PSA), - 11/18/2014 Unil Inguinal Hernia W/O obst or gang,non-recurrent, Right, He has a new right inguinal hernia. I will refer him to a general surgeon for evaluation and treatment. - 08/11/2016 Male ED, unspecified, Erectile dysfunction - 11/18/2014 BPH w/LUTS, Benign prostatic hyperplasia with urinary obstruction - 2016 Gross hematuria, Gross hematuria - 2015 Peyronies Disease, Peyronie's Disease - 2014 Primary hypogonadism, Hypogonadism, testicular - 2014 Renal cyst, Renal cysts, acquired, bilateral - 2014      PMH Notes: Gross hematuria: He was seen in the emergency room at [facility] on 03/31/13 for gross hematuria. He had seen Dr. Bernardo Heater  one week previously. He was found to have a normal creatinine of 0.99, normal serum calcium of 8.8 and a normal hemoglobin of 15.3. His urinalysis had red cells but only 3 white blood cells and no bacteria. He was placed on empiric Cipro however no culture was performed.  A CT scan without contrast revealed some perinephric stranding bilaterally of questionable significance but no other abnormality. He was noted to have prostatic enlargement but no bladder calculi.  CT scan 04/23/13 - no upper tract abnormality.  Cystoscopy with bladder biopsy 05/10/13 - cystitis cystica and cystitis follicularis only.  Cystoscopy 1/16 - no bladder lesion, suspected prostatic bleeding.   History of Peyronie's disease: He had significant dorsal curvature which was treated by Dr. Gareth Eagle at [facility] in the past.   BPH with outlet obstructive: He underwent KTP laser ablation of his prostate in 2008.   Bladder calculus: He was treated with cystolitholapaxy in 2008 at which time he underwent outlet resistance surgery as well.   Elevated PSA: He has a benign prostate but it is enlarged. His PSA has been as high as 5.95 in 10/14. It has remained low and stable.     NON-GU PMH: Encounter for general adult medical examination without abnormal findings, Encounter for preventive health examination - 2016 Personal history of other diseases of the digestive system, History of esophageal  reflux - 2014    FAMILY HISTORY: Death In The Family Father - Runs In Family Death In The Family Mother - [place]uns In Falls _4__ Living Daughter - Runs In Family Stroke Syndrome - Mother   SOCIAL HISTORY: Marital Status: Married Current Smoking Status: Patient has never smoked.  Social Drinker.  Drinks 2 caffeinated drinks per day.     Notes: Alcohol Use, Never A Smoker, Marital History - Currently Married, Caffeine Use, Retired From Work   REVIEW OF SYSTEMS:    GU Review Male:   Patient denies frequent  urination, hard to postpone urination, burning/ pain with urination, get up at night to urinate, leakage of urine, stream starts and stops, trouble starting your stream, have to strain to urinate , erection problems, and penile pain.  Gastrointestinal (Upper):   Patient denies nausea, vomiting, and indigestion/ heartburn.  Gastrointestinal (Lower):   Patient denies diarrhea and constipation.  Constitutional:   Patient denies fever, night sweats, weight loss, and fatigue.  Skin:   Patient denies skin rash/ lesion and itching.  Eyes:   Patient denies blurred vision and double vision.  Ears/ Nose/ Throat:   Patient denies sore throat and sinus problems.  Hematologic/Lymphatic:   Patient denies swollen glands and easy bruising.  Cardiovascular:   Patient denies leg swelling and chest pains.  Respiratory:   Patient denies cough and shortness of breath.  Endocrine:   Patient denies excessive thirst.  Musculoskeletal:   Patient denies back pain and joint pain.  Neurological:   Patient denies headaches and dizziness.  Psychologic:   Patient denies depression and anxiety.   VITAL SIGNS:      Weight 170 lb / 77.11 kg  Height 67 in / 170.18 cm  BP 136/68 mmHg  Pulse 54 /min  BMI 26.6 kg/m  Physical Exam  Constitutional: Well nourished and well developed . No acute distress.   ENT:. The ears and nose are normal in appearance.   Neck: The appearance of the neck is normal and no neck mass is present.   Pulmonary: No respiratory distress and normal respiratory rhythm and effort.   Cardiovascular: Heart rate and rhythm are normal . No peripheral edema.   Abdomen: The abdomen is soft and nontender. No masses are palpated. No CVA tenderness. No hernias are palpable. No hepatosplenomegaly noted.   Rectal: Rectal exam demonstrates normal sphincter tone, no tenderness and no masses. The prostate has no nodularity and is not tender. The left seminal vesicle is nonpalpable. The right seminal vesicle  is nonpalpable. The perineum is normal on inspection.   Genitourinary: Examination of the penis demonstrates no discharge, no masses, no lesions and a normal meatus. The scrotum is without lesions. The right epididymis is palpably normal and non-tender. The left epididymis is palpably normal and non-tender. The right testis is non-tender and without masses. The left testis is non-tender and without masses.   Lymphatics: The femoral and inguinal nodes are not enlarged or tender.   Skin: Normal skin turgor, no visible rash and no visible skin lesions.   Neuro/Psych:. Mood and affect are appropriate.      PAST DATA REVIEWED:  Source Of History:  Patient  Records Review:   Previous Patient Records, POC Tool   07/07/16 04/06/16 03/04/16 11/12/14 11/08/13 04/16/13  PSA  Total PSA 7.63  5.24  7.46 ng/dl 4.76  4.69  5.95   Free PSA  1.53   1.33  1.46  1.55   % Free PSA  29   28  31  26      PROCEDURES:          Urinalysis Dipstick Dipstick Cont'd  Color: Yellow Bilirubin: Neg  Appearance: Clear Ketones: Neg  Specific Gravity: 1.025 Blood: Neg  pH: 6.0 Protein: Trace  Glucose: Neg Urobilinogen: 0.2    Nitrites: Neg    Leukocyte Esterase: Neg    ASSESSMENT/PLAN: ED due to arterial insufficiency - N52.01 Stable - He has now failed oral agents, intraurethral suppositories and cannot perform intracavernosal injections. He is interested in proceeding with an inflatable penile prosthesis and therefore I went over the procedure with him in detail. Given him written information from [place] as well as a DVD and we then discussed the procedure in detail. I went over the incision used, the risks and complications including the complete removal of the prosthesis for infection. We discussed the fact that he has had previous hernia surgery on the right and has had abdominal mesh placed supraumbilically.   I gave him information from [place] regarding the procedure and device which he reviewed and  called indicating he would like to proceed with a 3-piece inflatable penile prosthesis.  I told him that this would be performed via a scrotal incision with the placement of the reservoir on the left-hand side and the pump on the right.

## 2017-01-05 NOTE — Discharge Instructions (Signed)
Penile prosthesis postoperative instructions ° °Wound: ° °In most cases your incision will have absorbable sutures that will dissolve within the first 10-20 days. Some will fall out even earlier. Expect some redness as the sutures dissolved but this should occur only around the sutures. If there is generalized redness, especially with increasing pain or swelling, let us know. The scrotum and penis will very likely get "black and blue" as the blood in the tissues spread. Sometimes the whole scrotum will turn colors. The black and blue is followed by a yellow and brown color. In time, all the discoloration will go away. In some cases some firm swelling in the area of the testicle and pump may persist for up to 4-6 weeks after the surgery and is considered normal in most cases. ° °Diet: ° °You may return to your normal diet within 24 hours following your surgery. You may note some mild nausea and possibly vomiting the first 6-8 hours following surgery. This is usually due to the side effects of anesthesia, and will disappear quite soon. I would suggest clear liquids and a very light meal the first evening following your surgery. ° °Activity: ° °Your physical activity should be restricted the first 48 hours. During that time you should remain relatively inactive, moving about only when necessary. During the first 7-10 days following surgery he should avoid lifting any heavy objects (anything greater than 15 pounds), and avoid strenuous exercise. If you work, ask us specifically about your restrictions, both for work and home. We will write a note to your employer if needed. ° °You should plan to wear a tight pair of jockey shorts or an athletic supporter for the first 4-5 days, even to sleep. This will keep the scrotum immobilized to some degree and keep the swelling down.The position of your penis will determine what is most comfortable but I strongly urge you to keep the penis in the "up" position (toward your  head). ° °Ice packs should be placed on and off over the scrotum for the first 48 hours. Frozen peas or corn in a ZipLock bag can be frozen, used and re-frozen. Fifteen minutes on and 15 minutes off is a reasonable schedule. The ice is a good pain reliever and keeps the swelling down. ° °Hygiene: ° °You may shower 48 hours after your surgery. Tub bathing should be restricted until the seventh day. ° °Medication: ° °You will be sent home with some type of pain medication. In many cases you will be sent home with a narcotic pain pill (hydrocodone or oxycodone). If the pain is not too bad, you may take either Tylenol (acetaminophen) or Advil (ibuprofen) which contain no narcotic agents, and might be tolerated a little better, with fewer side effects. If the pain medication you are sent home with does not control the pain, you will have to let us know. Some narcotic pain medications cannot be given or refilled by a phone call to a pharmacy. ° °Problems you should report to us: ° °· Fever of 101.0 degrees Fahrenheit or greater. °· Moderate or severe swelling under the skin incision or involving the scrotum. °Drug reaction such as hives, a rash, nausea or vomiting. °

## 2017-01-06 ENCOUNTER — Encounter (HOSPITAL_COMMUNITY)
Admission: RE | Disposition: A | Discharge status: Home / Self Care | Payer: Self-pay | Source: Ambulatory Visit | Attending: Urology

## 2017-01-06 ENCOUNTER — Ambulatory Visit (HOSPITAL_COMMUNITY): Payer: Medicare Other | Admitting: Anesthesiology

## 2017-01-06 ENCOUNTER — Ambulatory Visit (HOSPITAL_COMMUNITY)
Admission: RE | Admit: 2017-01-06 | Discharge: 2017-01-07 | Disposition: A | Discharge status: Home / Self Care | Payer: Medicare Other | Source: Ambulatory Visit | Attending: Urology | Admitting: Urology

## 2017-01-06 ENCOUNTER — Encounter (HOSPITAL_COMMUNITY): Payer: Self-pay | Admitting: Anesthesiology

## 2017-01-06 DIAGNOSIS — Z881 Allergy status to other antibiotic agents status: Secondary | ICD-10-CM | POA: Diagnosis not present

## 2017-01-06 DIAGNOSIS — N5201 Erectile dysfunction due to arterial insufficiency: Secondary | ICD-10-CM | POA: Insufficient documentation

## 2017-01-06 DIAGNOSIS — Z79899 Other long term (current) drug therapy: Secondary | ICD-10-CM | POA: Insufficient documentation

## 2017-01-06 DIAGNOSIS — R319 Hematuria, unspecified: Secondary | ICD-10-CM | POA: Diagnosis not present

## 2017-01-06 DIAGNOSIS — N529 Male erectile dysfunction, unspecified: Secondary | ICD-10-CM | POA: Diagnosis present

## 2017-01-06 DIAGNOSIS — G510 Bell's palsy: Secondary | ICD-10-CM | POA: Diagnosis not present

## 2017-01-06 DIAGNOSIS — N401 Enlarged prostate with lower urinary tract symptoms: Secondary | ICD-10-CM | POA: Diagnosis not present

## 2017-01-06 DIAGNOSIS — K219 Gastro-esophageal reflux disease without esophagitis: Secondary | ICD-10-CM | POA: Diagnosis not present

## 2017-01-06 DIAGNOSIS — N138 Other obstructive and reflux uropathy: Secondary | ICD-10-CM | POA: Diagnosis not present

## 2017-01-06 HISTORY — PX: PENILE PROSTHESIS IMPLANT: SHX240

## 2017-01-06 SURGERY — INSERTION, PENILE PROSTHESIS, INFLATABLE
Anesthesia: General

## 2017-01-06 MED ORDER — BACITRACIN 500 UNIT/GM EX OINT
TOPICAL_OINTMENT | CUTANEOUS | Status: DC | PRN
  Administered 2017-01-06: 1 via TOPICAL

## 2017-01-06 MED ORDER — VANCOMYCIN HCL IN DEXTROSE 1-5 GM/200ML-% IV SOLN
1000.0000 mg | Freq: Two times a day (BID) | INTRAVENOUS | Status: AC
  Administered 2017-01-06: 1000 mg via INTRAVENOUS
  Filled 2017-01-06: qty 200

## 2017-01-06 MED ORDER — OXYCODONE HCL 5 MG PO TABS: 5.0000 mg | ORAL_TABLET | Freq: Once | ORAL | Status: DC | PRN

## 2017-01-06 MED ORDER — POLYMYXIN B SULFATE 500000 UNITS IJ SOLR
INTRAMUSCULAR | Status: DC | PRN
  Administered 2017-01-06: 1000 mL

## 2017-01-06 MED ORDER — PHENYLEPHRINE HCL 10 MG/ML IJ SOLN
INTRAMUSCULAR | Status: AC
  Filled 2017-01-06: qty 1

## 2017-01-06 MED ORDER — FENTANYL CITRATE (PF) 100 MCG/2ML IJ SOLN
INTRAMUSCULAR | Status: DC | PRN
  Administered 2017-01-06 (×3): 25 ug via INTRAVENOUS
  Administered 2017-01-06: 50 ug via INTRAVENOUS
  Administered 2017-01-06: 25 ug via INTRAVENOUS
  Administered 2017-01-06 (×2): 50 ug via INTRAVENOUS

## 2017-01-06 MED ORDER — AMOXICILLIN-POT CLAVULANATE 875-125 MG PO TABS
1.0000 | ORAL_TABLET | Freq: Three times a day (TID) | ORAL | 0 refills | Status: DC
Start: 2017-01-06 — End: 2017-01-06

## 2017-01-06 MED ORDER — FENTANYL CITRATE (PF) 250 MCG/5ML IJ SOLN
INTRAMUSCULAR | Status: AC
  Filled 2017-01-06: qty 5

## 2017-01-06 MED ORDER — OXYCODONE HCL 10 MG PO TABS: 10.0000 mg | ORAL_TABLET | ORAL | 0 refills | Status: DC | PRN

## 2017-01-06 MED ORDER — LIDOCAINE HCL (CARDIAC) 20 MG/ML IV SOLN
INTRAVENOUS | Status: DC | PRN
  Administered 2017-01-06: 50 mg via INTRAVENOUS
  Administered 2017-01-06: 40 mg via INTRAVENOUS

## 2017-01-06 MED ORDER — BACITRACIN ZINC 500 UNIT/GM EX OINT
TOPICAL_OINTMENT | CUTANEOUS | Status: AC
  Filled 2017-01-06: qty 28.35

## 2017-01-06 MED ORDER — AMOXICILLIN-POT CLAVULANATE 875-125 MG PO TABS: 1.0000 | ORAL_TABLET | Freq: Three times a day (TID) | ORAL | 0 refills | Status: DC

## 2017-01-06 MED ORDER — ONDANSETRON HCL 4 MG/2ML IJ SOLN
INTRAMUSCULAR | Status: DC | PRN
  Administered 2017-01-06: 4 mg via INTRAVENOUS

## 2017-01-06 MED ORDER — FENTANYL CITRATE (PF) 100 MCG/2ML IJ SOLN: 25.0000 ug | INTRAMUSCULAR | Status: DC | PRN

## 2017-01-06 MED ORDER — 0.9 % SODIUM CHLORIDE (POUR BTL) OPTIME
TOPICAL | Status: DC | PRN
  Administered 2017-01-06: 2000 mL

## 2017-01-06 MED ORDER — SUGAMMADEX SODIUM 200 MG/2ML IV SOLN
INTRAVENOUS | Status: AC
  Filled 2017-01-06: qty 2

## 2017-01-06 MED ORDER — BACITRACIN-NEOMYCIN-POLYMYXIN 400-5-5000 EX OINT: 1.0000 "application " | TOPICAL_OINTMENT | Freq: Three times a day (TID) | CUTANEOUS | Status: DC | PRN

## 2017-01-06 MED ORDER — PROPOFOL 10 MG/ML IV BOLUS
INTRAVENOUS | Status: AC
  Filled 2017-01-06: qty 20

## 2017-01-06 MED ORDER — EPHEDRINE SULFATE 50 MG/ML IJ SOLN
INTRAMUSCULAR | Status: DC | PRN
  Administered 2017-01-06: 10 mg via INTRAVENOUS

## 2017-01-06 MED ORDER — LIDOCAINE 2% (20 MG/ML) 5 ML SYRINGE
INTRAMUSCULAR | Status: AC
  Filled 2017-01-06: qty 5

## 2017-01-06 MED ORDER — BUPIVACAINE-EPINEPHRINE (PF) 0.5% -1:200000 IJ SOLN
INTRAMUSCULAR | Status: DC | PRN
Start: 2017-01-06 — End: 2017-01-06
  Administered 2017-01-06: 15 mL

## 2017-01-06 MED ORDER — PROPOFOL 10 MG/ML IV BOLUS
INTRAVENOUS | Status: DC | PRN
  Administered 2017-01-06: 170 mg via INTRAVENOUS

## 2017-01-06 MED ORDER — BUPIVACAINE-EPINEPHRINE (PF) 0.5% -1:200000 IJ SOLN
INTRAMUSCULAR | Status: AC
  Filled 2017-01-06: qty 30

## 2017-01-06 MED ORDER — EPHEDRINE 5 MG/ML INJ
INTRAVENOUS | Status: AC
  Filled 2017-01-06: qty 10

## 2017-01-06 MED ORDER — OXYCODONE HCL 5 MG/5ML PO SOLN: 5.0000 mg | Freq: Once | ORAL | Status: DC | PRN

## 2017-01-06 MED ORDER — ROCURONIUM BROMIDE 100 MG/10ML IV SOLN
INTRAVENOUS | Status: DC | PRN
  Administered 2017-01-06: 50 mg via INTRAVENOUS
  Administered 2017-01-06 (×2): 10 mg via INTRAVENOUS

## 2017-01-06 MED ORDER — LACTATED RINGERS IV SOLN
INTRAVENOUS | Status: DC
  Administered 2017-01-06 (×2): via INTRAVENOUS

## 2017-01-06 MED ORDER — OXYCODONE HCL 5 MG PO TABS: 5.0000 mg | ORAL_TABLET | ORAL | Status: DC | PRN

## 2017-01-06 MED ORDER — DEXAMETHASONE SODIUM PHOSPHATE 10 MG/ML IJ SOLN
INTRAMUSCULAR | Status: DC | PRN
  Administered 2017-01-06: 4 mg via INTRAVENOUS

## 2017-01-06 MED ORDER — SODIUM CHLORIDE 0.9 % IV SOLN
INTRAVENOUS | Status: DC
  Administered 2017-01-06 (×2): via INTRAVENOUS

## 2017-01-06 MED ORDER — HYDROMORPHONE HCL-NACL 0.5-0.9 MG/ML-% IV SOSY: 0.5000 mg | PREFILLED_SYRINGE | INTRAVENOUS | Status: DC | PRN

## 2017-01-06 MED ORDER — SUGAMMADEX SODIUM 200 MG/2ML IV SOLN
INTRAVENOUS | Status: DC | PRN
  Administered 2017-01-06: 150 mg via INTRAVENOUS

## 2017-01-06 MED ORDER — VANCOMYCIN HCL IN DEXTROSE 1-5 GM/200ML-% IV SOLN
1000.0000 mg | INTRAVENOUS | Status: AC
  Administered 2017-01-06: 1000 mg via INTRAVENOUS
  Filled 2017-01-06: qty 200

## 2017-01-06 MED ORDER — MIDAZOLAM HCL 2 MG/2ML IJ SOLN
INTRAMUSCULAR | Status: AC
  Filled 2017-01-06: qty 2

## 2017-01-06 MED ORDER — ONDANSETRON HCL 4 MG/2ML IJ SOLN: 4.0000 mg | INTRAMUSCULAR | Status: DC | PRN

## 2017-01-06 MED ORDER — ONDANSETRON HCL 4 MG/2ML IJ SOLN
INTRAMUSCULAR | Status: AC
  Filled 2017-01-06: qty 2

## 2017-01-06 MED ORDER — SODIUM CHLORIDE 0.9 % IV SOLN
INTRAVENOUS | Status: AC
  Filled 2017-01-06 (×2): qty 500000

## 2017-01-06 MED ORDER — ROCURONIUM BROMIDE 50 MG/5ML IV SOSY
PREFILLED_SYRINGE | INTRAVENOUS | Status: AC
  Filled 2017-01-06: qty 5

## 2017-01-06 MED ORDER — ACETAMINOPHEN 500 MG PO TABS
1000.0000 mg | ORAL_TABLET | Freq: Four times a day (QID) | ORAL | Status: DC
  Administered 2017-01-06 – 2017-01-07 (×3): 1000 mg via ORAL
  Filled 2017-01-06 (×3): qty 2

## 2017-01-06 MED ORDER — GLYCOPYRROLATE 0.2 MG/ML IJ SOLN
INTRAMUSCULAR | Status: DC | PRN
  Administered 2017-01-06: 0.2 mg via INTRAVENOUS

## 2017-01-06 MED ORDER — MIDAZOLAM HCL 5 MG/5ML IJ SOLN
INTRAMUSCULAR | Status: DC | PRN
  Administered 2017-01-06: 1 mg via INTRAVENOUS

## 2017-01-06 SURGICAL SUPPLY — 48 items
BAG URINE DRAINAGE (UROLOGICAL SUPPLIES) ×2 IMPLANT
BANDAGE COBAN STERILE 2 (GAUZE/BANDAGES/DRESSINGS) ×2 IMPLANT
BENZOIN TINCTURE PRP APPL 2/3 (GAUZE/BANDAGES/DRESSINGS) IMPLANT
BLADE HEX COATED 2.75 (ELECTRODE) ×2 IMPLANT
BLADE SURG 15 STRL LF DISP TIS (BLADE) ×2 IMPLANT
BLADE SURG 15 STRL SS (BLADE) ×2
BNDG GAUZE ELAST 4 BULKY (GAUZE/BANDAGES/DRESSINGS) ×2 IMPLANT
CATH FOLEY 2WAY SLVR  5CC 16FR (CATHETERS) ×1
CATH FOLEY 2WAY SLVR 5CC 16FR (CATHETERS) ×1 IMPLANT
CHLORAPREP W/TINT 26ML (MISCELLANEOUS) IMPLANT
COVER MAYO STAND STRL (DRAPES) ×2 IMPLANT
COVER SURGICAL LIGHT HANDLE (MISCELLANEOUS) ×2 IMPLANT
DISSECTOR ROUND CHERRY 3/8 STR (MISCELLANEOUS) ×2 IMPLANT
DRAPE EXTREMITY T 121X128X90 (DRAPE) ×2 IMPLANT
DRAPE INCISE IOBAN 66X45 STRL (DRAPES) IMPLANT
DRSG TEGADERM 4X4.75 (GAUZE/BANDAGES/DRESSINGS) IMPLANT
ELECT REM PT RETURN 15FT ADLT (MISCELLANEOUS) ×2 IMPLANT
GAUZE SPONGE 4X4 12PLY STRL (GAUZE/BANDAGES/DRESSINGS) ×2 IMPLANT
GLOVE BIOGEL M 8.0 STRL (GLOVE) ×4 IMPLANT
GOWN STRL REUS W/TWL XL LVL3 (GOWN DISPOSABLE) ×2 IMPLANT
KIT BASIN OR (CUSTOM PROCEDURE TRAY) ×2 IMPLANT
KIT TITAN ASSEMBLY (Erectile Restoration) ×1 IMPLANT
KIT TITAN ASSEMBLY STANDARD (Erectile Restoration) ×1 IMPLANT
KIT TITAN ASSEMBLY STD (Erectile Restoration) ×1 IMPLANT
LUBRICANT JELLY K Y 4OZ (MISCELLANEOUS) ×2 IMPLANT
NEEDLE HYPO 22GX1.5 SAFETY (NEEDLE) ×2 IMPLANT
NS IRRIG 1000ML POUR BTL (IV SOLUTION) ×4 IMPLANT
PACK GENERAL/GYN (CUSTOM PROCEDURE TRAY) ×2 IMPLANT
PAD TELFA 2X3 NADH STRL (GAUZE/BANDAGES/DRESSINGS) ×2 IMPLANT
PLUG CATH AND CAP STER (CATHETERS) ×2 IMPLANT
PROS TITAN SCROT 0 ANG 18CM (Erectile Restoration) ×2 IMPLANT
PROSTHESIS TTN SCRO 0 ANG 18CM (Erectile Restoration) ×1 IMPLANT
RESERVOIR 75CC LOCKOUT BIOFLEX (Erectile Restoration) ×2 IMPLANT
RETRACTOR WILSON SYSTEM (INSTRUMENTS) ×2 IMPLANT
SCRUB TECHNI CARE 4 OZ NO DYE (MISCELLANEOUS) IMPLANT
SPONGE LAP 4X18 X RAY DECT (DISPOSABLE) IMPLANT
STRIP CLOSURE SKIN 1/2X4 (GAUZE/BANDAGES/DRESSINGS) IMPLANT
SUPPORT SCROTAL LG STRP (MISCELLANEOUS) ×2 IMPLANT
SUT CHROMIC 3 0 SH 27 (SUTURE) ×6 IMPLANT
SUT MNCRL AB 4-0 PS2 18 (SUTURE) ×2 IMPLANT
SUT PDS AB 2-0 CT2 27 (SUTURE) ×8 IMPLANT
SUT VIC AB 2-0 UR6 27 (SUTURE) ×4 IMPLANT
SYR 10ML LL (SYRINGE) ×2 IMPLANT
SYR 20CC LL (SYRINGE) ×2 IMPLANT
SYR 50ML LL SCALE MARK (SYRINGE) ×4 IMPLANT
SYR CONTROL 10ML LL (SYRINGE) ×2 IMPLANT
TOWEL OR 17X26 10 PK STRL BLUE (TOWEL DISPOSABLE) ×2 IMPLANT
WATER STERILE IRR 500ML POUR (IV SOLUTION) ×2 IMPLANT

## 2017-01-06 NOTE — Progress Notes (Signed)
Post-op note  Subjective: The patient is doing well.  No complaints.  Objective: Vital signs in last 24 hours: Temp:  [97.2 F (36.2 C)-98.4 F (36.9 C)] 97.4 F (36.3 C) (07/06 1124) Pulse Rate:  [56-84] 57 (07/06 1124) Resp:  [17-20] 19 (07/06 1124) BP: (126-144)/(66-95) 133/93 (07/06 1124) SpO2:  [97 %-100 %] 97 % (07/06 1124) Weight:  [79 kg (174 lb 3 oz)] 79 kg (174 lb 3 oz) (07/06 0707)  Intake/Output from previous day: No intake/output data recorded. Intake/Output this shift: Total I/O In: 1525 [I.V.:1525] Out: 150 [Urine:125; Blood:25]  Physical Exam:  General: Alert and oriented. Abdomen: Soft, Nondistended. Incisions: Clean and dry. GU: foley catheter to drainage, draining clear yellow urine. Mummy dressing in place, minimal blood staining around glans.   Lab Results: No results for input(s): HGB, HCT in the last 72 hours.  Assessment/Plan: POD#0 s/p IPP placement  1) Continue to monitor 2) Will D/C Foley catheter in a.m. 3) Anticipate discharge in a.m.  Jeffery Clark, MD  Patient was seen, examined,treatment plan was discussed with the resident.  I have directly reviewed the clinical findings, lab, imaging studies and management of this patient in detail. I have made the necessary changes and/or additions to the above noted documentation, and agree with the documentation, as recorded by the resident.    Jeffery Vasquez 01/06/2017, 5:10 PM

## 2017-01-06 NOTE — Op Note (Signed)
Urology Operative Note   Patient Name: Jeffery Vasquez   Preoperative Diagnosis: Erectile dysfunction  Postoperative Diagnosis: same  Procedure(s) Performed:   1. Implantation of three-piece inflatable penile prosthesis Surveyor, minerals)  Teaching Surgeon:  Kathie Rhodes, MD  Resident Surgeon:  [name], MD  Anesthesia:  General via endotracheal tube   Fluids:  See anesthesia record  Estimated blood loss:  10cc  Specimens:  None  Cultures: None    Device: 3 piece Titan: 70 cc reservoir, 18 cm cylinders on right and left sides  Drains:  16Fr foley catheter with 10 sterile water in balloon to drainage  Local medications: 20cc 0.5% ropivicaine with epinephrine   Complications:  None   Indications: Jeffery Vasquez is a 71 y.o. male with a history of organic erectile dysfunction . he presents today for implantation of inflatable penile prosthesis who has been treated with, unsuccessfully, with oral agents, vacuum erection device and intracavernosal injection therapy without success. He has elected to proceed with prosthesis implantation. Risks & benefits of the procedure discussed with the patient, who wishes to proceed.  Description:  The patient was correctly identified in the preop holding area where written informed consent as well potential risk and complication reviewed.  They were brought back to the operative suite where a preinduction timeout was performed. Once correct information was verified, general anesthesia was induced via endotracheal tube. They were then gently placed into supine position with SCDs in place for VTE prophylaxis. They were prepped and draped in the usual sterile fashion, with scrubbing of genitalia for 10 minutes with betadine and given appropriate preoperative antibiotics with vancomycin and gentamicin. A second timeout was then performed.   A 16 French Foley catheter was placed in the bladder and the bladder was drained and the catheter was plugged. A  midline median raphae scrotal incision was then made and then blunt dissection was used to expose the corpus cavernosum on the right and left sides. This was further cleared of overlying tissue using sharp technique. 2-0 PDS sutures were then placed in the corpus cavernosum to service stay sutures and then an incision was then made in the corpus cavernosum first on the left-hand side with the knife and then extended proximally and distally with the curved Mayo scissors. I then dilated the corpus cavernosum with the Hegar dilators starting at 10 and progressing to 14 proximally and distally. I then irrigated the corpus cavernosum with antibiotic solution and measured the distance proximally and distally from the stay suture and was found to be 18cm. I then turned my attention to the contralateral corpus cavernosum and placed my stay sutures, made my corporotomy and dilated the corpus cavernosum in an identical fashion. This was measured and also was found to be 18cm. It was irrigated with antibiotic solution as was the scrotum. I then chose an 18 cm cylinder without rear tip rear-tip extenders and these were prepped while I prepared the site for reservoir placement.  I used blunt technique to dissect up to the external inguinal ring on the left hand side.  I swept the spermatic cord laterally and then was able to bluntly dissect with my index finger through the floor of the inguinal ring. I drained the bladder completely with a Foley catheter and then developed a space behind the symphysis pubis for the reservoir. I irrigated the space with antibiotic solution and then placed the reservoir in this location. I then filled the reservoir with 70 cc of sterile saline.  Attention was redirected  to the corporotomies where the cylinders were then placed by first fixing the suture to the distal aspect of the right cylinder to a straight needle. This was then loaded on the [facility] inserter and passed through the  corporotomy and distally. I then advanced the straight needle with the [place]low inserter out through the glans and this was grasped with a hemostat and pulled through the glans and the suture was secured with a hemostat. I then performed an identical maneuver on the contralateral side. After this was performed I irrigated both corpus cavernosum and inserted the distal portion of the cylinder through the corporotomies and pulled this to the end of the corpora with the suture. The proximal aspect with the rear-tip extender was then passed through the corporotomy and into the seated position on each side. I then connected reservoir tubing to a syringe filled with sterile saline and inflated the device. I noted a good straight erection with both cylinders equidistant under the glans and no buckling of the cylinders. I therefore deflated the device and closed the corporotomies with running 2-0 PDS suture. The cylinder was then connected to the pump after excising the excess tubing with appropriate shodded hemostats in place and then I used the supplied connectors to make the connection. I then again cycled the device with the pump and it cycled properly. I deflated the device and pumped it up about three quarters of the way to aid with hemostasis. I irrigated the scrotum once again with antibiotic solution.  I then developed the scrotal pocket in the right hemiscrotum in place the pump in this location. I irrigated the wound one last time with antibiotic irrigation and then closed the deep scrotal tissue over the tubing and pump with running 3-0 chromic suture. A second layer was then closed over this first layer with running 3-0 chromic and then the skin was closed with running 3-0 chromic. Antibiotic ointment, a 4 x 4 and a Tegaderm were then applied to the scrotum. I then wrapped the scrotum and penis with a Kerlex. The catheter was connected to closed system drainage and the patient was awakened and taken recovery  room in stable and satisfactory condition. He tolerated the procedure well and there were no intraoperative complications. Needle sponge and instrument counts were correct at the end of the operation.  PLAN OF CARE: He will be observed overnight with intravenous antibiotics and pain medication with plan for discharge in the a.m.   PATIENT DISPOSITION:  PACU - hemodynamically stable.  I was present throughout the entire case and participated in every aspect of the operation from start to finish.

## 2017-01-06 NOTE — Anesthesia Preprocedure Evaluation (Signed)
Anesthesia Evaluation  Patient identified by MRN, date of birth, ID band Patient awake    Reviewed: Allergy & Precautions, H&P , NPO status , Patient's Chart, lab work & pertinent test results, reviewed documented beta blocker date and time   History of Anesthesia Complications Negative for: history of anesthetic complications  Airway Mallampati: I  TM Distance: >3 FB Neck ROM: full    Dental  (+) Caps, Teeth Intact   Pulmonary neg pulmonary ROS,    breath sounds clear to auscultation       Cardiovascular Exercise Tolerance: Good negative cardio ROS   Rhythm:Regular     Neuro/Psych neg Seizures  Neuromuscular disease (facial nerve palsy) negative psych ROS   GI/Hepatic Neg liver ROS, GERD  ,  Endo/Other  negative endocrine ROS  Renal/GU Renal disease (kidney)  negative genitourinary   Musculoskeletal   Abdominal   Peds  Hematology negative hematology ROS (+)   Anesthesia Other Findings   Reproductive/Obstetrics negative OB ROS                             Anesthesia Physical Anesthesia Plan  ASA: II  Anesthesia Plan: General   Post-op Pain Management:    Induction: Intravenous  PONV Risk Score and Plan: 2 and Ondansetron, Dexamethasone and Treatment may vary due to age or medical condition  Airway Management Planned: Oral ETT and LMA  Additional Equipment: None  Intra-op Plan:   Post-operative Plan: Extubation in OR  Informed Consent: I have reviewed the patients History and Physical, chart, labs and discussed the procedure including the risks, benefits and alternatives for the proposed anesthesia with the patient or authorized representative who has indicated his/her understanding and acceptance.   Dental advisory given  Plan Discussed with: CRNA and Surgeon  Anesthesia Plan Comments:         Anesthesia Quick Evaluation

## 2017-01-06 NOTE — Transfer of Care (Signed)
Immediate Anesthesia Transfer of Care Note  Patient: Jeffery Vasquez  Procedure(s) Performed: Procedure(s): PENILE PROTHESIS INFLATABLE (N/A)  Patient Location: PACU  Anesthesia Type:General  Level of Consciousness: awake, alert  and oriented  Airway & Oxygen Therapy: Patient Spontanous Breathing and Patient connected to face mask oxygen  Post-op Assessment: Report given to RN and Post -op Vital signs reviewed and stable  Post vital signs: Reviewed and stable  Last Vitals:  Vitals:   01/06/17 0635  BP: 126/66  Pulse: (!) 56  Resp: 18  Temp: 36.9 C    Last Pain:  Vitals:   01/06/17 0800  TempSrc:   PainSc: 0-No pain      Patients Stated Pain Goal: 3 (58/30/74 6002)  Complications: No apparent anesthesia complications

## 2017-01-06 NOTE — Anesthesia Procedure Notes (Signed)
Procedure Name: Intubation Date/Time: 01/06/2017 8:17 AM Performed by: Sherian Maroon A Pre-anesthesia Checklist: Emergency Drugs available, Patient being monitored and Timeout performed Patient Re-evaluated:Patient Re-evaluated prior to inductionOxygen Delivery Method: Circle system utilized Preoxygenation: Pre-oxygenation with 100% oxygen Intubation Type: IV induction Ventilation: Mask ventilation without difficulty Laryngoscope Size: Mac and 4 Grade View: Grade I Tube type: Oral Tube size: 7.0 mm Number of attempts: 1 Airway Equipment and Method: Stylet Placement Confirmation: ETT inserted through vocal cords under direct vision,  positive ETCO2 and breath sounds checked- equal and bilateral Secured at: 22 cm Tube secured with: Tape Dental Injury: Teeth and Oropharynx as per pre-operative assessment

## 2017-01-07 DIAGNOSIS — N5201 Erectile dysfunction due to arterial insufficiency: Secondary | ICD-10-CM | POA: Diagnosis not present

## 2017-01-07 DIAGNOSIS — K219 Gastro-esophageal reflux disease without esophagitis: Secondary | ICD-10-CM | POA: Diagnosis not present

## 2017-01-07 DIAGNOSIS — Z881 Allergy status to other antibiotic agents status: Secondary | ICD-10-CM | POA: Diagnosis not present

## 2017-01-07 DIAGNOSIS — G510 Bell's palsy: Secondary | ICD-10-CM | POA: Diagnosis not present

## 2017-01-07 DIAGNOSIS — Z79899 Other long term (current) drug therapy: Secondary | ICD-10-CM | POA: Diagnosis not present

## 2017-01-07 MED ORDER — AMOXICILLIN-POT CLAVULANATE 875-125 MG PO TABS: 1.0000 | ORAL_TABLET | Freq: Two times a day (BID) | ORAL | 0 refills | Status: AC

## 2017-01-07 MED ORDER — ACETAMINOPHEN 325 MG PO TABS: 650.0000 mg | ORAL_TABLET | ORAL | Status: DC | PRN

## 2017-01-07 NOTE — Progress Notes (Signed)
Reviewed discharge information with patient and caregiver. Answered all questions. Patient/caregiver able to teach back medications and reasons to contact MD/911. Patient verbalizes importance of PCP follow up appointment.  Ader Fritze M. Yazen Rosko, RN  

## 2017-01-07 NOTE — Anesthesia Postprocedure Evaluation (Signed)
Anesthesia Post Note  Patient: Jeffery Vasquez  Procedure(s) Performed: Procedure(s) (LRB): PENILE PROTHESIS INFLATABLE (N/A)     Patient location during evaluation: PACU Anesthesia Type: General Level of consciousness: awake and alert Pain management: pain level controlled Vital Signs Assessment: post-procedure vital signs reviewed and stable Respiratory status: spontaneous breathing, nonlabored ventilation, respiratory function stable and patient connected to nasal cannula oxygen Cardiovascular status: blood pressure returned to baseline and stable Postop Assessment: no signs of nausea or vomiting Anesthetic complications: no    Last Vitals:  Vitals:   01/07/17 0121 01/07/17 0429  BP: (!) 106/51 114/67  Pulse: (!) 50 (!) 58  Resp: 18 18  Temp: 36.6 C 36.5 C    Last Pain:  Vitals:   01/07/17 0833  TempSrc:   PainSc: 0-No pain                 Paiton Fosco

## 2017-01-07 NOTE — Discharge Summary (Signed)
Patient ID: PACEY ALTIZER MRN: [id number] DOB/AGE: 73-Jan-1947 73 y.o.  Admit date: 01/06/2017 Discharge date: 01/07/2017  Primary Care Physician:  Arnetha Courser, MD  Discharge Diagnoses:   Present on Admission: . Organic erectile dysfunction   Consults:  None     Discharge Medications: Allergies as of 01/07/2017      Reactions   Cephalexin Other (See Comments)   SEVERE STOMACH AND ABDOMINAL ISSUES   Cephalosporins Other (See Comments)   SEVERE STOMACH AND ABDOMINAL ISSUES      Medication List    TAKE these medications   amoxicillin-clavulanate 875-125 MG tablet Commonly known as:  AUGMENTIN Take 1 tablet by mouth 2 (two) times daily.   fluticasone 50 MCG/ACT nasal spray Commonly known as:  FLONASE Place 1 spray into both nostrils daily as needed for allergies or rhinitis.   Oxycodone HCl 10 MG Tabs Take 1 tablet (10 mg total) by mouth every 4 (four) hours as needed.   polyethylene glycol packet Commonly known as:  MIRALAX / GLYCOLAX Take 17 g by mouth daily as needed for mild constipation or moderate constipation.        Significant Diagnostic Studies:  No results found.  Brief H and P: For complete details please refer to admission H and P, but in brief the pt is admitted for IPP placement.  Hospital Course:  Active Problems:   Organic erectile dysfunction Pt underwent IPP placement on 7/6. Following benign postop course he was discharged on POD 1 after successful void.  Day of Discharge BP 114/67 (BP Location: Right Arm)   Pulse (!) 58   Temp 97.7 F (36.5 C) (Oral)   Resp 18   Ht 5\' 7"  (1.702 m)   Wt 79 kg (174 lb 3 oz)   SpO2 97%   BMI 27.28 kg/m   No results found for this or any previous visit (from the past 24 hour(s)).  Physical Exam: General: Alert and awake oriented x3 not in any acute distress. HEENT: anicteric sclera, pupils reactive to light and accommodation CVS: S1-S2 clear no murmur rubs or gallops Chest: clear to auscultation  bilaterally, no wheezing rales or rhonchi Abdomen: soft nontender, nondistended, normal bowel sounds, no organomegaly Extremities: no cyanosis, clubbing or edema noted bilaterally Neuro: Cranial nerves II-XII intact, no focal neurological deficits  Disposition:  Home  Diet:  Regular  Activity:  Gradually increase   Disposition and Follow-up:    F/U arranged   [place], [facility] On 01/13/2017.   Why:  For your appiontment at 9:30 Contact information: [place] [id number] [phone]           Time spent on Discharge:  10 mins  Signed: Jorja Loa 01/07/2017, 8:37 AM

## 2017-01-13 DIAGNOSIS — N5201 Erectile dysfunction due to arterial insufficiency: Secondary | ICD-10-CM | POA: Diagnosis not present

## 2017-01-20 DIAGNOSIS — N5201 Erectile dysfunction due to arterial insufficiency: Secondary | ICD-10-CM | POA: Diagnosis not present

## 2017-02-09 ENCOUNTER — Ambulatory Visit: Payer: Medicare Other | Attending: Internal Medicine | Admitting: Physical Therapy

## 2017-02-09 DIAGNOSIS — R279 Unspecified lack of coordination: Secondary | ICD-10-CM | POA: Insufficient documentation

## 2017-02-09 DIAGNOSIS — M6281 Muscle weakness (generalized): Secondary | ICD-10-CM | POA: Diagnosis not present

## 2017-02-09 DIAGNOSIS — R972 Elevated prostate specific antigen [PSA]: Secondary | ICD-10-CM | POA: Diagnosis not present
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
## 2017-02-13 ENCOUNTER — Ambulatory Visit: Payer: Medicare Other | Admitting: Physical Therapy

## 2017-02-13 ENCOUNTER — Encounter: Payer: Self-pay | Admitting: Physical Therapy

## 2017-02-13 DIAGNOSIS — R279 Unspecified lack of coordination: Secondary | ICD-10-CM

## 2017-02-13 DIAGNOSIS — M6281 Muscle weakness (generalized): Secondary | ICD-10-CM | POA: Diagnosis not present

## 2017-02-13 NOTE — Patient Instructions (Addendum)
Start in a supine position, one leg bent with foot flat on the bed/floor, and the other leg crossed over the knee.  Gently push the knee of the crossed leg forward until a strong yet pain-free stretch is felt.  Hold for 30 seconds then release. 2 times each hip.  Repeat as many sets as instructed, then switch sides and repeat.    Strengthening: Hip Abduction (Side-Lying)    Tighten muscles on front of left thigh, then lift leg __6__ inches from surface, keeping knee locked.  Repeat _10___ times per set. Do ___1_ sets per session. Do _1___ sessions per day. Both sides  http://orth.exer.us/622   Copyright  VHI. All rights reserved.  About Abdominal Massage  Abdominal massage, also called external colon massage, is a self-treatment circular massage technique that can reduce and eliminate gas and ease constipation. The colon naturally contracts in waves in a clockwise direction starting from inside the right hip, moving up toward the ribs, across the belly, and down inside the left hip.  When you perform circular abdominal massage, you help stimulate your colon's normal wave pattern of movement called peristalsis.  It is most beneficial when done after eating.  Positioning You can practice abdominal massage with oil while lying down, or in the shower with soap.  Some people find that it is just as effective to do the massage through clothing while sitting or standing.  How to Massage Start by placing your finger tips or knuckles on your right side, just inside your hip bone.  . Make small circular movements while you move upward toward your rib cage.   . Once you reach the bottom right side of your rib cage, take your circular movements across to the left side of the bottom of your rib cage.  . Next, move downward until you reach the inside of your left hip bone.  This is the path your feces travel in your colon. . Continue to perform your abdominal massage in this pattern for 10 minutes  each day.     You can apply as much pressure as is comfortable in your massage.  Start gently and build pressure as you continue to practice.  Notice any areas of pain as you massage; areas of slight pain may be relieved as you massage, but if you have areas of significant or intense pain, consult with your healthcare provider.  Other Considerations . General physical activity including bending and stretching can have a beneficial massage-like effect on the colon.  Deep breathing can also stimulate the colon because breathing deeply activates the same nervous system that supplies the colon.   . Abdominal massage should always be used in combination with a bowel-conscious diet that is high in the proper type of fiber for you, fluids (primarily water), and a regular exercise program. Toileting Techniques for Bowel Movements (Defecation) Using your belly (abdomen) and pelvic floor muscles to have a bowel movement is usually instinctive.  Sometimes people can have problems with these muscles and have to relearn proper defecation (emptying) techniques.  If you have weakness in your muscles, organs that are falling out, decreased sensation in your pelvis, or ignore your urge to go, you may find yourself straining to have a bowel movement.  You are straining if you are: . holding your breath or taking in a huge gulp of air and holding it  . keeping your lips and jaw tensed and closed tightly . turning red in the face because of excessive pushing  or forcing . developing or worsening your  hemorrhoids . getting faint while pushing . not emptying completely and have to defecate many times a day  If you are straining, you are actually making it harder for yourself to have a bowel movement.  Many people find they are pulling up with the pelvic floor muscles and closing off instead of opening the anus. Due to lack pelvic floor relaxation and coordination the abdominal muscles, one has to work harder to push the  feces out.  Many people have never been taught how to defecate efficiently and effectively.  Notice what happens to your body when you are having a bowel movement.  While you are sitting on the toilet pay attention to the following areas: . Jaw and mouth position . Angle of your hips   . Whether your feet touch the ground or not . Arm placement  . Spine position . Waist . Belly tension . Anus (opening of the anal canal)  An Evacuation/Defecation Plan   Here are the 4 basic points:  1. Lean forward enough for your elbows to rest on your knees 2. Support your feet on the floor or use a low stool if your feet don't touch the floor  3. Push out your belly as if you have swallowed a beach ball-you should feel a widening of your waist 4. Open and relax your pelvic floor muscles, rather than tightening around the anus      The following conditions my require modifications to your toileting posture:  . If you have had surgery in the past that limits your back, hip, pelvic, knee or ankle flexibility . Constipation   Your healthcare practitioner may make the following additional suggestions and adjustments:  1) Sit on the toilet  a) Make sure your feet are supported. b) Notice your hip angle and spine position-most people find it effective to lean forward or raise their knees, which can help the muscles around the anus to relax  c) When you lean forward, place your forearms on your thighs for support  2) Relax suggestions a) Breath deeply in through your nose and out slowly through your mouth as if you are smelling the flowers and blowing out the candles. b) To become aware of how to relax your muscles, contracting and releasing muscles can be helpful.  Pull your pelvic floor muscles in tightly by using the image of holding back gas, or closing around the anus (visualize making a circle smaller) and lifting the anus up and in.  Then release the muscles and your anus should drop down and  feel open. Repeat 5 times ending with the feeling of relaxation. c) Keep your pelvic floor muscles relaxed; let your belly bulge out. d) The digestive tract starts at the mouth and ends at the anal opening, so be sure to relax both ends of the tube.  Place your tongue on the roof of your mouth with your teeth separated.  This helps relax your mouth and will help to relax the anus at the same time.  3) Empty (defecation) a) Keep your pelvic floor and sphincter relaxed, then bulge your anal muscles.  Make the anal opening wide.  b) Stick your belly out as if you have swallowed a beach ball. c) Make your belly wall hard using your belly muscles while continuing to breathe. Doing this makes it easier to open your anus. d) Breath out and give a grunt (or try using other sounds such as ahhhh, shhhhh,  ohhhh or grrrrrrr).  4) Finish a) As you finish your bowel movement, pull the pelvic floor muscles up and in.  This will leave your anus in the proper place rather than remaining pushed out and down. If you leave your anus pushed out and down, it will start to feel as though that is normal and give you incorrect signals about needing to have a bowel movement.    Earlie Counts, PT [facility] [address] [facility] [place] [facility], [place] [address]
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
## 2017-02-21 ENCOUNTER — Ambulatory Visit: Payer: Medicare Other | Admitting: Physical Therapy

## 2017-02-21 ENCOUNTER — Encounter: Payer: Self-pay | Admitting: Physical Therapy

## 2017-02-21 DIAGNOSIS — R279 Unspecified lack of coordination: Secondary | ICD-10-CM

## 2017-02-21 DIAGNOSIS — M6281 Muscle weakness (generalized): Secondary | ICD-10-CM | POA: Diagnosis not present
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
## 2017-02-21 NOTE — Patient Instructions (Addendum)
Quick Contraction: Gravity Resisted (Standing)    Standing, quickly squeeze then fully relax pelvic floor. Perform _1__ sets of _5__. Rest for _1__ seconds between sets. Do _3__ times a day.  Copyright  VHI. All rights reserved.    Slow Contraction: Gravity Resisted (Standing)    Standing, slowly squeeze pelvic floor for _10__ seconds. Rest for _5__ seconds. Repeat _10__ times. Do _3__ times a day.  Copyright  VHI. All rights reserved.  Introduction to Bowel Health Diet and daily habits can help you predict when your bowels will move on a regular basis.  The consistency and quantity of the stool is usually more important than the frequency.  The goal is to have a regular bowel movement that is soft but formed.   Tips on Emptying Regularly . Eat breakfast.  Usually the best time of day for a bowel movement will be a half hour to an hour after eating.  These times are best because the body uses the gastrocolic reflex, a stimulation of bowel motion that occurs with eating, to help produce a bowel movement.  For some people even a simple hot drink in the morning can help the reflex action begin. . Eat all your meals at a predictable time each day.  The bowel functions best when food is introduced at the same regular intervals. . The amount of food eaten at a given time of day should be about the same size from day to day.  The bowel functions best when food is introduced in similar quantities from day to day. It is fine to have a small breakfast and a large lunch, or vice versa, just be consistent. . Eat two servings of fruit or vegetables and at least one serving of a complex carbohydrates (whole grains such as brown rice, bran, whole wheat bread, or oatmeal) at each meal. . Drink plenty of water-ideally eight glasses a day.  Be sure to increase your water intake if you are increasing fiber into your diet.  Maintain Healthy Habits . Exercise daily.  You may exercise at any time of day,  but you may find that bowel function is helped most if the exercise is at a consistent time each day. . Make sure that you are not rushed and have convenient access to a bathroom at your selected time to empty your bowels. [facility] [address], [place], [place] [address] Phone # [phone] Fax [phone]

## 2017-02-28 ENCOUNTER — Encounter: Payer: Non-veteran care | Admitting: Physical Therapy

## 2017-03-07 ENCOUNTER — Encounter: Payer: Non-veteran care | Admitting: Physical Therapy

## 2017-03-14 ENCOUNTER — Ambulatory Visit: Payer: Non-veteran care | Attending: Internal Medicine | Admitting: Physical Therapy

## 2017-03-14 ENCOUNTER — Encounter: Payer: Self-pay | Admitting: Physical Therapy

## 2017-03-14 DIAGNOSIS — R279 Unspecified lack of coordination: Secondary | ICD-10-CM

## 2017-03-14 DIAGNOSIS — M6281 Muscle weakness (generalized): Secondary | ICD-10-CM | POA: Insufficient documentation
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
## 2017-03-14 NOTE — Patient Instructions (Addendum)
Quick Contraction: Gravity Resisted (Standing)    Standing, quickly squeeze then fully relax pelvic floor. Perform _1__ sets of _5__. Rest for _1__ seconds between sets. Do _3__ times a day.  Copyright  VHI. All rights reserved.    Slow Contraction: Gravity Resisted (Standing)    Standing, slowly squeeze pelvic floor for _10__ seconds. Rest for _5__ seconds. Repeat _10__ times. Do _3__ times a day.  Copyright  VHI. All rights reserved.   Physical Therapy      Start in a supine position, one leg bent with foot flat on the bed/floor, and the other leg crossed over the knee.  Gently push the knee of the crossed leg forward until a strong yet pain-free stretch is felt.  Hold for 30 seconds then release. 2 times each hip.  Repeat as many sets as instructed, then switch sides and repeat.    Strengthening: Hip Abduction (Side-Lying)    Tighten muscles on front of left thigh, then lift leg __6__ inches from surface, keeping knee locked.  Repeat _10___ times per set. Do ___1_ sets per session. Do _1___ sessions per day. Both sides  http://orth.exer.us/622   Copyright  VHI. All rights reserved.  About Abdominal Massage  Abdominal massage, also called external colon massage, is a self-treatment circular massage technique that can reduce and eliminate gas and ease constipation. The colon naturally contracts in waves in a clockwise direction starting from inside the right hip, moving up toward the ribs, across the belly, and down inside the left hip.  When you perform circular abdominal massage, you help stimulate your colon's normal wave pattern of movement called peristalsis.  It is most beneficial when done after eating.  Positioning You can practice abdominal massage with oil while lying down, or in the shower with soap.  Some people find that it is just as effective to do the massage through clothing while sitting or standing.  How to Massage Start by placing your  finger tips or knuckles on your right side, just inside your hip bone.   Make small circular movements while you move upward toward your rib cage.    Once you reach the bottom right side of your rib cage, take your circular movements across to the left side of the bottom of your rib cage.   Next, move downward until you reach the inside of your left hip bone.  This is the path your feces travel in your colon.  Continue to perform your abdominal massage in this pattern for 10 minutes each day.     You can apply as much pressure as is comfortable in your massage.  Start gently and build pressure as you continue to practice.  Notice any areas of pain as you massage; areas of slight pain may be relieved as you massage, but if you have areas of significant or intense pain, consult with your healthcare provider.  Other Considerations  General physical activity including bending and stretching can have a beneficial massage-like effect on the colon.  Deep breathing can also stimulate the colon because breathing deeply activates the same nervous system that supplies the colon.    Abdominal massage should always be used in combination with a bowel-conscious diet that is high in the proper type of fiber for you, fluids (primarily water), and a regular exercise program.     Toileting Techniques for Bowel Movements (Defecation)  Using your belly (abdomen) and pelvic floor muscles to have a bowel movement is usually instinctive.  Sometimes people can  have problems with these muscles and have to relearn proper defecation (emptying) techniques.  If you have weakness in your muscles, organs that are falling out, decreased sensation in your pelvis, or ignore your urge to go, you may find yourself straining to have a bowel movement.  You are straining if you are:   holding your breath or taking in a huge gulp of air and holding it   keeping your lips and jaw tensed and closed tightly  turning red in the  face because of excessive pushing or forcing  developing or worsening your  hemorrhoids  getting faint while pushing  not emptying completely and have to defecate many times a day  If you are straining, you are actually making it harder for yourself to have a bowel movement.  Many people find they are pulling up with the pelvic floor muscles and closing off instead of opening the anus. Due to lack pelvic floor relaxation and coordination the abdominal muscles, one has to work harder to push the feces out.  Many people have never been taught how to defecate efficiently and effectively.  Notice what happens to your body when you are having a bowel movement.  While you are sitting on the toilet pay attention to the following areas:  Jaw and mouth position  Angle of your hips    Whether your feet touch the ground or not  Arm placement   Spine position  Waist  Belly tension  Anus (opening of the anal canal)  An Evacuation/Defecation Plan   Here are the 4 basic points:  1. Lean forward enough for your elbows to rest on your knees 2. Support your feet on the floor or use a low stool if your feet don't touch the floor  3. Push out your belly as if you have swallowed a beach ball-you should feel a widening of your waist 4. Open and relax your pelvic floor muscles, rather than tightening around the anus      The following conditions my require modifications to your toileting posture:   If you have had surgery in the past that limits your back, hip, pelvic, knee or ankle flexibility  Constipation   Your healthcare practitioner may make the following additional suggestions and adjustments:  1. Sit on the toilet  a)   Make sure your feet are supported. b)   Notice your hip angle and spine position-most people find it effective to lean forward or raise their knees, which can help the muscles around the anus to relax  c)   When you lean forward, place your forearms on  your thighs for support  2. Relax suggestions a)   Breath deeply in through your nose and out slowly through your mouth as if you are smelling the flowers and blowing out the candles. b)   To become aware of how to relax your muscles, contracting and releasing muscles can be helpful.  Pull your pelvic floor muscles in tightly by using the image of holding back gas, or closing around the anus (visualize making a circle smaller) and lifting the anus up and in.  Then release the muscles and your anus should drop down and feel open. Repeat 5 times ending with the feeling of relaxation. c)   Keep your pelvic floor muscles relaxed; let your belly bulge out. d)   The digestive tract starts at the mouth and ends at the anal opening, so be sure torelax both ends of the tube.  Place your  tongue on the roof of your mouth withyour teeth separated.  This helps relax your mouth and will help to relax the anus at the same time.  3. Empty (defecation) a)   Keep your pelvic floor and sphincter relaxed, then bulge your anal muscles.  Make the anal opening wide.  b)   Stick your belly out as if you have swallowed a beach ball. c)   Make your belly wall hard using your belly muscles while continuing to breathe. Doing this makes it easier to open your anus. d)   Breath out and give a grunt (or try using other sounds such as ahhhh, shhhhh, ohhhh or grrrrrrr).  4)   Finish a)   As you finish your bowel movement, pull the pelvic floor muscles up and in.  This will leave your anus in the proper place rather than remaining pushed out and down. If you leave your anus pushed out and down, it will start to feel as though that is normal and give you incorrect signals about needing to have a bowel movement.   Supine Knee-to-Chest, Bilateral    Lie on back, hands clasped behind both knees. Pull knees in toward chest until a comfortable stretch is felt in lower back and buttocks. Hold _15_ seconds. Repeat __2_ times per  session. Do _1__ sessions per day.  Copyright  VHI. All rights reserved.  Supine    Lie on back, legs bent and feet flat. Grasp behind one leg and slowly try to straighten knee. Hold _30__ seconds.  Repeat _2__ times per session. Do _1__ sessions per day.  Copyright  VHI. All rights reserved.  Butterfly, Supine    Lie on back, feet together. Lower knees toward floor. Hold _30__ seconds. Repeat _2__ times per session. Do __1_ sessions per day.  Copyright  VHI. All rights reserved.    Supine With Rotation    Lie, back flat, legs bent, feet together. Rotate knees to one side. Hold _30__ seconds. Repeat to other side. Repeat _2__ times per session. Do _1__ sessions per day.  Copyright  VHI. All rights reserved.  Center [address] [place], [place] [address] Phone # [phone] Fax [phone]

## 2017-03-21 ENCOUNTER — Encounter: Payer: Non-veteran care | Admitting: Physical Therapy

## 2017-03-28 DIAGNOSIS — N5201 Erectile dysfunction due to arterial insufficiency: Secondary | ICD-10-CM | POA: Diagnosis not present

## 2017-04-06 DIAGNOSIS — N5201 Erectile dysfunction due to arterial insufficiency: Secondary | ICD-10-CM | POA: Diagnosis not present

## 2017-04-24 ENCOUNTER — Other Ambulatory Visit: Payer: Self-pay

## 2017-04-24 DIAGNOSIS — R918 Other nonspecific abnormal finding of lung field: Secondary | ICD-10-CM

## 2017-04-25 ENCOUNTER — Encounter: Payer: Self-pay | Admitting: Family Medicine

## 2017-04-25 ENCOUNTER — Ambulatory Visit (INDEPENDENT_AMBULATORY_CARE_PROVIDER_SITE_OTHER): Payer: Medicare Other | Admitting: Family Medicine

## 2017-04-25 DIAGNOSIS — Z8249 Family history of ischemic heart disease and other diseases of the circulatory system: Secondary | ICD-10-CM | POA: Diagnosis not present

## 2017-04-25 DIAGNOSIS — E782 Mixed hyperlipidemia: Secondary | ICD-10-CM

## 2017-04-25 DIAGNOSIS — Z23 Encounter for immunization: Secondary | ICD-10-CM | POA: Insufficient documentation

## 2017-04-25 DIAGNOSIS — R972 Elevated prostate specific antigen [PSA]: Secondary | ICD-10-CM

## 2017-04-25 DIAGNOSIS — Z0000 Encounter for general adult medical examination without abnormal findings: Secondary | ICD-10-CM

## 2017-04-25 DIAGNOSIS — Z789 Other specified health status: Secondary | ICD-10-CM | POA: Diagnosis not present
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
## 2017-04-25 NOTE — Patient Instructions (Addendum)
Consider daily aspirin 81 mg (coated to protect your stomach) in between blood donations   Health Maintenance, Male A healthy lifestyle and preventive care is important for your health and wellness. Ask your health care provider about what schedule of regular examinations is right for you. What should I know about weight and diet? Eat a Healthy Diet  Eat plenty of vegetables, fruits, whole grains, low-fat dairy products, and lean protein.  Do not eat a lot of foods high in solid fats, added sugars, or salt.  Maintain a Healthy Weight Regular exercise can help you achieve or maintain a healthy weight. You should:  Do at least 150 minutes of exercise each week. The exercise should increase your heart rate and make you sweat (moderate-intensity exercise).  Do strength-training exercises at least twice a week.  Watch Your Levels of Cholesterol and Blood Lipids  Have your blood tested for lipids and cholesterol every 5 years starting at 73 years of age. If you are at high risk for heart disease, you should start having your blood tested when you are 73 years old. You may need to have your cholesterol levels checked more often if: ? Your lipid or cholesterol levels are high. ? You are older than 73 years of age. ? You are at high risk for heart disease.  What should I know about cancer screening? Many types of cancers can be detected early and may often be prevented. Lung Cancer  You should be screened every year for lung cancer if: ? You are a current smoker who has smoked for at least 30 years. ? You are a former smoker who has quit within the past 15 years.  Talk to your health care provider about your screening options, when you should start screening, and how often you should be screened.  Colorectal Cancer  Routine colorectal cancer screening usually begins at 73 years of age and should be repeated every 5-10 years until you are 73 years old. You may need to be screened more  often if early forms of precancerous polyps or small growths are found. Your health care provider may recommend screening at an earlier age if you have risk factors for colon cancer.  Your health care provider may recommend using home test kits to check for hidden blood in the stool.  A small camera at the end of a tube can be used to examine your colon (sigmoidoscopy or colonoscopy). This checks for the earliest forms of colorectal cancer.  Prostate and Testicular Cancer  Depending on your age and overall health, your health care provider may do certain tests to screen for prostate and testicular cancer.  Talk to your health care provider about any symptoms or concerns you have about testicular or prostate cancer.  Skin Cancer  Check your skin from head to toe regularly.  Tell your health care provider about any new moles or changes in moles, especially if: ? There is a change in a mole's size, shape, or color. ? You have a mole that is larger than a pencil eraser.  Always use sunscreen. Apply sunscreen liberally and repeat throughout the day.  Protect yourself by wearing long sleeves, pants, a wide-brimmed hat, and sunglasses when outside.  What should I know about heart disease, diabetes, and high blood pressure?  If you are 24-16 years of age, have your blood pressure checked every 3-5 years. If you are 35 years of age or older, have your blood pressure checked every year. You should have  your blood pressure measured twice-once when you are at a hospital or clinic, and once when you are not at a hospital or clinic. Record the average of the two measurements. To check your blood pressure when you are not at a hospital or clinic, you can use: ? An automated blood pressure machine at a pharmacy. ? A home blood pressure monitor.  Talk to your health care provider about your target blood pressure.  If you are between 35-58 years old, ask your health care provider if you should take  aspirin to prevent heart disease.  Have regular diabetes screenings by checking your fasting blood sugar level. ? If you are at a normal weight and have a low risk for diabetes, have this test once every three years after the age of 85. ? If you are overweight and have a high risk for diabetes, consider being tested at a younger age or more often.  A one-time screening for abdominal aortic aneurysm (AAA) by ultrasound is recommended for men aged 43-75 years who are current or former smokers. What should I know about preventing infection? Hepatitis B If you have a higher risk for hepatitis B, you should be screened for this virus. Talk with your health care provider to find out if you are at risk for hepatitis B infection. Hepatitis C Blood testing is recommended for:  Everyone born from [birth year] through 1967.  Anyone with known risk factors for hepatitis C.  Sexually Transmitted Diseases (STDs)  You should be screened each year for STDs including gonorrhea and chlamydia if: ? You are sexually active and are younger than 73 years of age. ? You are older than 73 years of age and your health care provider tells you that you are at risk for this type of infection. ? Your sexual activity has changed since you were last screened and you are at an increased risk for chlamydia or gonorrhea. Ask your health care provider if you are at risk.  Talk with your health care provider about whether you are at high risk of being infected with HIV. Your health care provider may recommend a prescription medicine to help prevent HIV infection.  What else can I do?  Schedule regular health, dental, and eye exams.  Stay current with your vaccines (immunizations).  Do not use any tobacco products, such as cigarettes, chewing tobacco, and e-cigarettes. If you need help quitting, ask your health care provider.  Limit alcohol intake to no more than 2 drinks per day. One drink equals 12 ounces of beer, 5 ounces of  wine, or 1 ounces of hard liquor.  Do not use street drugs.  Do not share needles.  Ask your health care provider for help if you need support or information about quitting drugs.  Tell your health care provider if you often feel depressed.  Tell your health care provider if you have ever been abused or do not feel safe at home. This information is not intended to replace advice given to you by your health care provider. Make sure you discuss any questions you have with your health care provider. Document Released: 12/17/2007 Document Revised: 02/17/2016 Document Reviewed: 03/24/2015 Elsevier Interactive Patient Education  [employer].

## 2017-04-25 NOTE — Assessment & Plan Note (Signed)
Check lipids 

## 2017-04-25 NOTE — Assessment & Plan Note (Signed)
Followed by urologist.

## 2017-04-25 NOTE — Assessment & Plan Note (Signed)
Patient wishes for attempt at resuscitation; okay with chest compressions, shocking, intubation, mechanical ventilation; however, would not want to remain on life support after about five days if not getting any better or it's hopeless

## 2017-04-25 NOTE — Assessment & Plan Note (Signed)
USPSTF grade A and B recommendations reviewed with patient; age-appropriate recommendations, preventive care, screening tests, etc discussed and encouraged; healthy living encouraged; see AVS for patient education given to patient  

## 2017-04-26 LAB — LIPID PANEL
Cholesterol: 215 mg/dL — ABNORMAL HIGH (ref <200)
HDL: 51 mg/dL (ref >40)
LDL Cholesterol (Calc): 138 mg/dL (calc) — ABNORMAL HIGH
NON-HDL CHOLESTEROL (CALC): 164 mg/dL — AB (ref <130)
Total CHOL/HDL Ratio: 4.2 (calc) (ref <5.0)
Triglycerides: 137 mg/dL (ref <150)

## 2017-04-26 LAB — BASIC METABOLIC PANEL
BUN: 17 mg/dL (ref 7–25)
CHLORIDE: 103 mmol/L (ref 98–110)
CO2: 29 mmol/L (ref 20–32)
CREATININE: 1.06 mg/dL (ref 0.70–1.18)
Calcium: 9.2 mg/dL (ref 8.6–10.3)
GLUCOSE: 92 mg/dL (ref 65–99)
Potassium: 4.9 mmol/L (ref 3.5–5.3)
SODIUM: 140 mmol/L (ref 135–146)

## 2017-05-15 ENCOUNTER — Ambulatory Visit
Admission: RE | Admit: 2017-05-15 | Discharge: 2017-05-15 | Disposition: A | Discharge status: Home / Self Care | Payer: Medicare Other | Source: Ambulatory Visit | Attending: Cardiothoracic Surgery | Admitting: Cardiothoracic Surgery

## 2017-05-15 DIAGNOSIS — R911 Solitary pulmonary nodule: Secondary | ICD-10-CM | POA: Diagnosis not present

## 2017-05-15 DIAGNOSIS — I700 Atherosclerosis of aorta: Secondary | ICD-10-CM | POA: Insufficient documentation

## 2017-05-15 DIAGNOSIS — I2510 Atherosclerotic heart disease of native coronary artery without angina pectoris: Secondary | ICD-10-CM | POA: Insufficient documentation

## 2017-05-15 DIAGNOSIS — J479 Bronchiectasis, uncomplicated: Secondary | ICD-10-CM | POA: Insufficient documentation

## 2017-05-15 DIAGNOSIS — R918 Other nonspecific abnormal finding of lung field: Secondary | ICD-10-CM | POA: Diagnosis not present

## 2017-05-15 DIAGNOSIS — K760 Fatty (change of) liver, not elsewhere classified: Secondary | ICD-10-CM | POA: Diagnosis not present

## 2017-05-19 ENCOUNTER — Ambulatory Visit (INDEPENDENT_AMBULATORY_CARE_PROVIDER_SITE_OTHER): Payer: Medicare Other | Admitting: Cardiothoracic Surgery

## 2017-05-19 ENCOUNTER — Encounter: Payer: Self-pay | Admitting: Cardiothoracic Surgery

## 2017-05-19 VITALS — BP 159/77 | HR 57 | Temp 97.9°F | Wt 181.0 lb

## 2017-05-19 DIAGNOSIS — R918 Other nonspecific abnormal finding of lung field: Secondary | ICD-10-CM | POA: Diagnosis not present

## 2017-05-19 NOTE — Patient Instructions (Signed)
Please give us a call in case you have any questions or concerns.  

## 2017-05-19 NOTE — Progress Notes (Signed)
  Patient ID: Jeffery Vasquez, male   DOB: 1946-03-06, 71 y.o.   MRN: [id number]  HISTORY: [name] returns today.  He has no complaints.  He is not short of breath.  He has no cough.  He states that he feels 100% and normal.   Vitals:   05/19/17 0853  BP: (!) 159/77  Pulse: (!) 57  Temp: 97.9 F (36.6 C)  SpO2: 95%     EXAM:    Resp: Lungs are clear bilaterally.  No respiratory distress, normal effort. Heart:  Regular without murmurs Abd:  Abdomen is soft, non distended and non tender. No masses are palpable.  There is no rebound and no guarding.  Neurological: Alert and oriented to person, place, and time. Coordination normal.  Skin: Skin is warm and dry. No rash noted. No diaphoretic. No erythema. No pallor.  Psychiatric: Normal mood and affect. Normal behavior. Judgment and thought content normal.    ASSESSMENT: I have independently reviewed the patient's CT scan he had made and compared to the one that was made back in March.  The previously identified lung infiltrates have now essentially resolved.  There are no new suspicious findings.   PLAN:   I did not make a return visit at this time as I believe that there is no suspicious lesions that require follow-up present on the CT scan.  He will return to see [place] as needed.    Nestor Lewandowsky, MD

## 2017-05-23 DIAGNOSIS — H01005 Unspecified blepharitis left lower eyelid: Secondary | ICD-10-CM | POA: Diagnosis not present

## 2017-05-23 DIAGNOSIS — H2513 Age-related nuclear cataract, bilateral: Secondary | ICD-10-CM | POA: Diagnosis not present

## 2017-05-23 DIAGNOSIS — H524 Presbyopia: Secondary | ICD-10-CM | POA: Diagnosis not present

## 2017-06-08 ENCOUNTER — Telehealth: Payer: Self-pay | Admitting: Family Medicine

## 2017-06-08 NOTE — Telephone Encounter (Signed)
Requesting something for diverticulitis.

## 2017-06-08 NOTE — Telephone Encounter (Signed)
Copied from [place] [id number]. Topic: Quick Communication - See Telephone Encounter >> Jun 08, 2017 12:57 PM Hewitt Shorts wrote: CRM for notification. See Telephone encounter for:  Pt is calling to see if something for diverticulitis  06/08/17.best number is [phone]

## 2017-06-08 NOTE — Telephone Encounter (Signed)
Pt called does have hx of diverticulitis, states is hurting in RUQ and "He knows this is what it is"  Can  You give antibiotic?

## 2017-06-08 NOTE — Telephone Encounter (Signed)
Please direct him to urgent care or ER based on the severity of his symptoms

## 2017-06-08 NOTE — Telephone Encounter (Signed)
I called the patient back after hours (I was with a patient until after 5:30 pm) He had a fever last night, also chills; having abdominal pain on the left side He had diverticulitis once before, evaluated and scanned at the [place]; he thinks that is what it is again I explained that he needs to go to the urgent care now Explained that he should not wait; diverticulitis, if that's what is going on, can be serious, can form abscesses, can perforated, and cannot wait for me to see him tomorrow; he needs to get checked out now; he agrees

## 2017-06-09 DIAGNOSIS — R109 Unspecified abdominal pain: Secondary | ICD-10-CM | POA: Diagnosis not present

## 2017-06-09 DIAGNOSIS — K5780 Diverticulitis of intestine, part unspecified, with perforation and abscess without bleeding: Secondary | ICD-10-CM | POA: Diagnosis not present

## 2017-07-25 DIAGNOSIS — L218 Other seborrheic dermatitis: Secondary | ICD-10-CM | POA: Diagnosis not present

## 2017-07-25 DIAGNOSIS — L820 Inflamed seborrheic keratosis: Secondary | ICD-10-CM | POA: Diagnosis not present

## 2017-08-16 DIAGNOSIS — T83410A Breakdown (mechanical) of penile (implanted) prosthesis, initial encounter: Secondary | ICD-10-CM | POA: Diagnosis not present

## 2017-08-16 DIAGNOSIS — R972 Elevated prostate specific antigen [PSA]: Secondary | ICD-10-CM | POA: Diagnosis not present

## 2017-08-21 DIAGNOSIS — H833X9 Noise effects on inner ear, unspecified ear: Secondary | ICD-10-CM | POA: Diagnosis not present

## 2017-08-21 DIAGNOSIS — D333 Benign neoplasm of cranial nerves: Secondary | ICD-10-CM | POA: Diagnosis not present

## 2017-08-21 DIAGNOSIS — H90A21 Sensorineural hearing loss, unilateral, right ear, with restricted hearing on the contralateral side: Secondary | ICD-10-CM | POA: Diagnosis not present

## 2017-08-21 DIAGNOSIS — J301 Allergic rhinitis due to pollen: Secondary | ICD-10-CM | POA: Diagnosis not present

## 2017-08-29 ENCOUNTER — Encounter: Payer: Self-pay | Admitting: Family Medicine

## 2017-08-29 DIAGNOSIS — J301 Allergic rhinitis due to pollen: Secondary | ICD-10-CM | POA: Diagnosis not present

## 2017-08-30 ENCOUNTER — Encounter: Payer: Self-pay | Admitting: Family Medicine

## 2017-09-05 ENCOUNTER — Other Ambulatory Visit: Payer: Self-pay

## 2017-09-05 ENCOUNTER — Encounter (HOSPITAL_BASED_OUTPATIENT_CLINIC_OR_DEPARTMENT_OTHER): Payer: Self-pay | Admitting: *Deleted

## 2017-09-05 ENCOUNTER — Other Ambulatory Visit: Payer: Self-pay | Admitting: Urology

## 2017-09-05 NOTE — Progress Notes (Signed)
SPOKE W/ PT VIA PHONE FOR PRE-OP INTERVIEW.  NPO AFTER MN.  ARRIVE AT [id number].  NEEDS HG.  PER PT HIS GOING TO SHOWER W/ HIBICLENS AM DOS.

## 2017-09-08 NOTE — H&P (Signed)
HPI: Jeffery Vasquez is a 72 year-old male with a malfunctioning penile prosthesis.  He has had problems with his prosthesis. He does not have problems with autoinflation. He does not have problems with penile sensation. His erections are straight.   He has not had fever and chills. He is not having pain. He has not had redness and erythema at his incision site.   He had a 3 piece [employer] IPP placed on 01/06/17.   Interval history 01/20/17: At his last visit and attempt was made to deflate his partially inflated prosthesis that had been left like that to help tapenade and the bleeding. When the pump was grasped he reflects of bleeding raised his knee and it struck Jiles Crocker.  He was not able to relieve pressure from device.   02/13/17: He is doing well. He has no discomfort in the penis or scrotum.   04/06/17: He has returned today as he was recently seen urgently by Dr. Louis Meckel because he was unable to deflate his prosthesis. The prosthesis was deflated without difficulty by Dr. Louis Meckel and he has returned for teaching on proper use of his device.  He has not tried to inflate or deflate the device since he was here last. He has no pain over any of the components to suggest infection.   08/16/17: When I saw him in 10/18 he met with myself and the [employer] representative and we gave him some advice and point orders on how to inflate and deflate the device. He said he has had continued difficulty inflating his device. He said he called the company and spoke to someone who indicated that there perineally had been a problem with this occurring and other patients. He was referred to another person who gave him no real information other than to follow up with me. He was told to hold the deflation button in and pump the device which he says he can do but when he tries to inflate the device he is unable to pump.     CC: Elevated PSA - Established Patient  HPI: His last PSA was performed 02/09/2017. The last  PSA value was 4.45.   He has not had a prostate biopsy. Patient does not have a family history of prostate cancer. The patient states he does not take 5 alpha reductase inhibitor medication.   02/13/17: He returns today to go over the results of his latest PSA blood work and also for me to make sure that he can properly inflated and deflated his penile prosthesis.   08/16/17:     ALLERGIES: Cephalexin CAPS Cephalosporins    MEDICATIONS: Muse 1,000 mcg suppository, urethral 1 cartridge Per Urethra PRN  Triple Mix PGE1 40 Micrograms, Papaverine 30 mg, phentolamine 1 mg and lidocaine 10 mg/mL Dispense 10 cc  Colon [facility]     GU PSH: Cysto Bladder Stone <2.5cm - 2014 Cysto Bladder Ureth Biopsy - 2014 Insertion IPP - 01/06/2017 Laser Surgery Prostate - 2014 Penile Injection - 08/15/2016 Peyronies Grafting - 2014      PSH Notes: Surgery Penis Excision Of Penile Plaque W/ Graft To 5 Cm  Craniotomy Excision Of Acoustic Neuroma   NON-GU PSH: Diagnostic Colonoscopy - 2014 Endo Cholangiopancreatograph - 2014 Ligate/divide/excise Vein - 2014 Nose Surgery (Unspecified) Remove Tonsils - 2014 Sinus surgery    GU PMH: ED due to arterial insufficiency, I met with the patient and [employer] rep and we went over how he should use the device and how to properly inflate and  deflate the device. It is completely deflated. I recommendation is to attempt these maneuvers that we described to him during business hours so that he could come in and be seen here if he was having difficulty with his device. - 04/06/2017, (Stable), His prosthesis cycles properly. He is able to inflate and deflate the device himself. He is going to cycle it twice a day for 1 month. I have given him the green light to go ahead and use it., - 02/13/2017 (Stable), Return as previously scheduled to begin cycling and using the device., - 01/20/2017, - 01/13/2017 (Stable), He has now failed oral agents, intraurethral suppositories and  cannot perform intracavernosal injections. He is interested in proceeding with an inflatable penile prosthesis and therefore I went over the procedure with him in detail. Given him written information from [place] as well as a DVD and we then discussed the procedure in detail. I went over the incision used, the risks and complications including the complete removal of the prosthesis for infection. We discussed the fact that he has had previous hernia surgery on the right and has had abdominal mesh placed supraumbilically., - 11/17/2016 (Stable), I had a long discussion with him today about the fact that he did not have "internal bleeding" as much as he did have some bleeding underneath the skin which tracked beneath the skin and caused discoloration of the penis which has nearly resolved. I told him that there would be no long-term effects. We then discussed several methods of injection in order to help prevent this in the future. I told him he should try to avoid any obvious superficial vascular structures on the skin and also to inject into the corpus cavernosum at a 45 angle. Finally I recommended applying pressure to the injection site for 1 minute and this should eliminate this problem in the future. He was placed into this., - 08/24/2016 (Stable), It appears this is going to be effective for him. He will return to see me as previously scheduled for follow-up of his elevated PSA., - 08/15/2016 (Worsening), He continues to have difficulty with erectile dysfunction and failed a trial of Muse intraurethral suppositories. I therefore have discussed using intracavernosal injections and he would like to proceed with this. He will be prescribed the medication and return for an intracavernosal test injection., - 08/11/2016 (Worsening), We discussed intraurethral suppositories and intracavernosal injections as other options for treatment. He wanted to try the intraurethral suppositories., - 04/06/2016 (Worsening, Chronic),  He has fairly significant erectile dysfunction. We discussed the treatment options today., - 12/25/2015 Elevated PSA (Stable), His most recent PSA is now down to 4.45 with a free to total ratio 27% indicating that this is almost certainly due to benign causes. I told him we will continue to monitor this and therefore I will have him return in 6 months for a PSA and then in 12 months for DRE and PSA. - 02/13/2017, (Stable), He has a history of PSA elevation with a free to total ratio that would suggest a benign etiology however I had seen some variation in his PSA and therefore my recommendation has been to continue to follow this with a repeat PSA again in 6 months and then again in 6 months following that with DRE again at that time., - 08/11/2016 (Worsening), He has had a significant increase in his PSA. No abnormality was noted on DRE. I have recommended repeating the PSA and if it remains elevated to proceed with a prostate biopsy., - 04/06/2016 (Stable),  His prostate was noted to be benign on exam. I will check a PSA today., - 12/25/2015, Elevated prostate specific antigen (PSA), - 2014-09-11 Unil Inguinal Hernia W/O obst or gang,non-recurrent, Right, He has a new right inguinal hernia. I will refer him to a general surgeon for evaluation and treatment. - 08/11/2016 Male ED, unspecified, Erectile dysfunction - 09-11-2014 BPH w/LUTS, Benign prostatic hyperplasia with urinary obstruction - 09-11-2014 Gross hematuria, Gross hematuria - 09/11/13 Peyronies Disease, Peyronie's Disease - 11-Sep-2012 Primary hypogonadism, Hypogonadism, testicular - 09/11/2012 Renal cyst, Renal cysts, acquired, bilateral - 09/11/2012      PMH Notes: Gross hematuria: He was seen in the emergency room at [facility] on 03/31/13 for gross hematuria. He had seen Dr. Bernardo Heater one week previously. He was found to have a normal creatinine of 0.99, normal serum calcium of 8.8 and a normal hemoglobin of 15.3. His urinalysis had red cells but only 3 white blood cells and  no bacteria. He was placed on empiric Cipro however no culture was performed.  A CT scan without contrast revealed some perinephric stranding bilaterally of questionable significance but no other abnormality. He was noted to have prostatic enlargement but no bladder calculi.  CT scan 04/23/13 - no upper tract abnormality.  Cystoscopy with bladder biopsy 05/10/13 - cystitis cystica and cystitis follicularis only.  Cystoscopy 1/16 - no bladder lesion, suspected prostatic bleeding.   History of Peyronie's disease: He had significant dorsal curvature which was treated by Dr. Gareth Eagle at [facility] in the past.   BPH with outlet obstructive: He underwent KTP laser ablation of his prostate in 09/11/2006.   Bladder calculus: He was treated with cystolitholapaxy in 2006/09/11 at which time he underwent outlet resistance surgery as well.   Elevated PSA: He has a benign prostate but it is enlarged. His PSA has been as high as 5.95 in 10/14. It has remained low and stable.     NON-GU PMH: Encounter for general adult medical examination without abnormal findings, Encounter for preventive health examination - 09/11/2014 Personal history of other diseases of the digestive system, History of esophageal reflux - 09/11/12    FAMILY HISTORY: Death In The Family Father - Runs In Family Death In The Family Mother - Runs In [place] _4__ Living Daughter - Runs In Family Stroke Syndrome - Mother   SOCIAL HISTORY: Marital Status: Married Preferred Language: English; Ethnicity: Not Hispanic Or Latino; Race: White Current Smoking Status: Patient has never smoked.  Social Drinker.  Drinks 2 caffeinated drinks per day.    REVIEW OF SYSTEMS:    GU Review Male:   Patient reports erection problems. Patient denies frequent urination, hard to postpone urination, burning/ pain with urination, get up at night to urinate, leakage of urine, stream starts and stops, trouble starting your stream, have to strain to urinate ,  and penile pain.  Gastrointestinal (Upper):   Patient denies nausea, vomiting, and indigestion/ heartburn.  Gastrointestinal (Lower):   Patient denies diarrhea and constipation.  Constitutional:   Patient denies fever, night sweats, weight loss, and fatigue.  Skin:   Patient denies skin rash/ lesion and itching.  Eyes:   Patient denies blurred vision and double vision.  Ears/ Nose/ Throat:   Patient denies sore throat and sinus problems.  Hematologic/Lymphatic:   Patient denies swollen glands and easy bruising.  Cardiovascular:   Patient denies leg swelling and chest pains.  Respiratory:   Patient denies cough and shortness of breath.  Endocrine:   Patient denies  excessive thirst.  Musculoskeletal:   Patient denies back pain and joint pain.  Neurological:   Patient denies headaches and dizziness.  Psychologic:   Patient denies depression and anxiety.   VITAL SIGNS:    Weight 172 lb / 78.02 kg  Height 67 in / 170.18 cm  BP 142/80 mmHg  Pulse 68 /min  BMI 26.9 kg/m    Physical Exam  Constitutional: Well nourished and well developed . No acute distress.   ENT:. The ears and nose are normal in appearance.   Neck: The appearance of the neck is normal and no neck mass is present.   Pulmonary: No respiratory distress and normal respiratory rhythm and effort.   Cardiovascular: Heart rate and rhythm are normal . No peripheral edema.   Abdomen: The abdomen is soft and nontender. No masses are palpated. No CVA tenderness. No hernias are palpable. No hepatosplenomegaly noted.   Rectal: Rectal exam demonstrates normal sphincter tone, no tenderness and no masses. The prostate has no nodularity and is not tender. The left seminal vesicle is nonpalpable. The right seminal vesicle is nonpalpable. The perineum is normal on inspection.   Genitourinary: Examination of the penis demonstrates no discharge, no masses, no lesions and a normal meatus with prosthesis cylinders noted to be in  good location with no tenderness. The scrotum is without lesions. The right epididymis is palpably normal and non-tender. The left epididymis is palpably normal and non-tender. The right testis is non-tender and without masses. The left testis is non-tender and without masses. His pump is in good position in the right hemiscrotum. There is no tenderness. I cannot inflate the device with the pump.  Lymphatics: The femoral and inguinal nodes are not enlarged or tender.   Skin: Normal skin turgor, no visible rash and no visible skin lesions.   Neuro/Psych: Mood and affect are appropriate.      PAST DATA REVIEWED:  Source Of History:  Patient  Lab Test Review:   BUN/Creatinine  Records Review:   Previous Patient Records, POC Tool   02/09/17 07/07/16 04/06/16 03/04/16 11/12/14 11/08/13 04/16/13  PSA  Total PSA 4.45 ng/mL 7.63  5.24  7.46 ng/dl 4.76  4.69  5.95   Free PSA 1.19 ng/mL  1.53   1.33  1.46  1.55   % Free PSA 27 % PSA  _0 Notes:                     His creatinine in 10/18 was normal at 1.06.   PROCEDURES:          Urinalysis w/Scope Dipstick Dipstick Cont'd Micro  Color: Yellow Bilirubin: Neg WBC/hpf: NS (Not Seen)  Appearance: Clear Ketones: Neg RBC/hpf: 0 - 2/hpf  Specific Gravity: 1.015 Blood: Trace Bacteria: NS (Not Seen)  pH: 5.5 Protein: Neg Cystals: NS (Not Seen)  Glucose: Neg Urobilinogen: 0.2 Casts: NS (Not Seen)    Nitrites: Neg Trichomonas: Not Present    Leukocyte Esterase: Neg Mucous: Not Present      Epithelial Cells: NS (Not Seen)      Yeast: NS (Not Seen)      Sperm: Not Present    ASSESSMENT/PLAN:     ICD-10 Details  1 GU:   Elevated PSA - R97.20 Stable - He has a history of an elevated PSA but has always maintained a excellent free to total ratio. I will recheck his PSA today.  2   Other and  unspec post-procedural ED - N52.39 Stable - He continues to have erectile dysfunction due to a malfunctioning penile prosthesis.  3   Breakdown  (mech) implanted penile prosthesis, initial enc - T83.410A I told him that I agreed that his pump does not appear to be functioning properly. It was functioning initially after the prosthesis was implanted but I told him that now there may be some scar tissue inhibiting the function of the pump. He was concerned that there could be some kinking of the tubing. We also discussed the fact that there may be some malfunction of the valve of the pump. I told him that my recommendation would be to undergo scrotal exploration with freeing up of the pump from any potential scar tissue, evaluating for any kinking of the tubing and correcting this and then replacing the pump if it is found to be malfunctioning.

## 2017-09-10 NOTE — Anesthesia Preprocedure Evaluation (Addendum)
Anesthesia Evaluation  Patient identified by MRN, date of birth, ID band Patient awake    Reviewed: Allergy & Precautions, H&P , NPO status , Patient's Chart, lab work & pertinent test results, reviewed documented beta blocker date and time   History of Anesthesia Complications Negative for: history of anesthetic complications  Airway Mallampati: I  TM Distance: >3 FB Neck ROM: full    Dental  (+) Caps, Teeth Intact   Pulmonary neg pulmonary ROS,    breath sounds clear to auscultation       Cardiovascular Exercise Tolerance: Good negative cardio ROS   Rhythm:Regular     Neuro/Psych neg Seizures Balance problem due to vestibular dysfunction of right ear  Neuromuscular disease (facial nerve palsy) negative psych ROS   GI/Hepatic Neg liver ROS, GERD  ,  Endo/Other  negative endocrine ROS  Renal/GU Renal disease (kidney)  negative genitourinary   Musculoskeletal  (+) Arthritis , Osteoarthritis,    Abdominal   Peds  Hematology negative hematology ROS (+)   Anesthesia Other Findings   Reproductive/Obstetrics negative OB ROS                            Anesthesia Physical  Anesthesia Plan  ASA: III  Anesthesia Plan: General   Post-op Pain Management:    Induction: Intravenous  PONV Risk Score and Plan: 2 and Ondansetron, Dexamethasone and Treatment may vary due to age or medical condition  Airway Management Planned: Oral ETT and LMA  Additional Equipment: None  Intra-op Plan:   Post-operative Plan: Extubation in OR  Informed Consent: I have reviewed the patients History and Physical, chart, labs and discussed the procedure including the risks, benefits and alternatives for the proposed anesthesia with the patient or authorized representative who has indicated his/her understanding and acceptance.   Dental advisory given  Plan Discussed with: CRNA and Surgeon  Anesthesia Plan  Comments:        Anesthesia Quick Evaluation

## 2017-09-11 ENCOUNTER — Encounter (HOSPITAL_BASED_OUTPATIENT_CLINIC_OR_DEPARTMENT_OTHER)
Admission: RE | Disposition: A | Discharge status: Home / Self Care | Payer: Self-pay | Source: Ambulatory Visit | Attending: Urology

## 2017-09-11 ENCOUNTER — Ambulatory Visit (HOSPITAL_BASED_OUTPATIENT_CLINIC_OR_DEPARTMENT_OTHER): Payer: Medicare Other | Admitting: Anesthesiology

## 2017-09-11 ENCOUNTER — Other Ambulatory Visit: Payer: Self-pay

## 2017-09-11 ENCOUNTER — Ambulatory Visit (HOSPITAL_BASED_OUTPATIENT_CLINIC_OR_DEPARTMENT_OTHER)
Admission: RE | Admit: 2017-09-11 | Discharge: 2017-09-11 | Disposition: A | Discharge status: Home / Self Care | Payer: Medicare Other | Source: Ambulatory Visit | Attending: Urology | Admitting: Urology

## 2017-09-11 ENCOUNTER — Encounter (HOSPITAL_BASED_OUTPATIENT_CLINIC_OR_DEPARTMENT_OTHER): Payer: Self-pay | Admitting: *Deleted

## 2017-09-11 DIAGNOSIS — T83490A Other mechanical complication of penile (implanted) prosthesis, initial encounter: Secondary | ICD-10-CM | POA: Insufficient documentation

## 2017-09-11 DIAGNOSIS — Z881 Allergy status to other antibiotic agents status: Secondary | ICD-10-CM | POA: Insufficient documentation

## 2017-09-11 DIAGNOSIS — Z79899 Other long term (current) drug therapy: Secondary | ICD-10-CM | POA: Diagnosis not present

## 2017-09-11 DIAGNOSIS — N529 Male erectile dysfunction, unspecified: Secondary | ICD-10-CM | POA: Insufficient documentation

## 2017-09-11 DIAGNOSIS — K219 Gastro-esophageal reflux disease without esophagitis: Secondary | ICD-10-CM | POA: Insufficient documentation

## 2017-09-11 DIAGNOSIS — Z95810 Presence of automatic (implantable) cardiac defibrillator: Secondary | ICD-10-CM | POA: Diagnosis not present

## 2017-09-11 DIAGNOSIS — T83490D Other mechanical complication of penile (implanted) prosthesis, subsequent encounter: Secondary | ICD-10-CM

## 2017-09-11 DIAGNOSIS — M1990 Unspecified osteoarthritis, unspecified site: Secondary | ICD-10-CM | POA: Diagnosis not present

## 2017-09-11 DIAGNOSIS — Y828 Other medical devices associated with adverse incidents: Secondary | ICD-10-CM | POA: Diagnosis not present

## 2017-09-11 DIAGNOSIS — E785 Hyperlipidemia, unspecified: Secondary | ICD-10-CM | POA: Diagnosis not present

## 2017-09-11 DIAGNOSIS — T83410A Breakdown (mechanical) of penile (implanted) prosthesis, initial encounter: Secondary | ICD-10-CM | POA: Diagnosis not present

## 2017-09-11 HISTORY — DX: Personal history of other benign neoplasm: Z86.018

## 2017-09-11 HISTORY — PX: SCROTAL EXPLORATION: SHX2386

## 2017-09-11 HISTORY — DX: Personal history of other malignant neoplasm of skin: Z85.828

## 2017-09-11 HISTORY — DX: Personal history of other diseases of the digestive system: Z87.19

## 2017-09-11 SURGERY — EXPLORATION, SCROTUM
Anesthesia: General | Site: Scrotum

## 2017-09-11 MED ORDER — PROPOFOL 10 MG/ML IV BOLUS
INTRAVENOUS | Status: AC
  Filled 2017-09-11: qty 20

## 2017-09-11 MED ORDER — FENTANYL CITRATE (PF) 100 MCG/2ML IJ SOLN
25.0000 ug | INTRAMUSCULAR | Status: DC | PRN
  Filled 2017-09-11: qty 1

## 2017-09-11 MED ORDER — MIDAZOLAM HCL 2 MG/2ML IJ SOLN
INTRAMUSCULAR | Status: AC
  Filled 2017-09-11: qty 2

## 2017-09-11 MED ORDER — LIDOCAINE HCL (CARDIAC) 20 MG/ML IV SOLN
INTRAVENOUS | Status: AC
  Filled 2017-09-11: qty 5

## 2017-09-11 MED ORDER — LIDOCAINE 2% (20 MG/ML) 5 ML SYRINGE
INTRAMUSCULAR | Status: DC | PRN
  Administered 2017-09-11: 100 mg via INTRAVENOUS

## 2017-09-11 MED ORDER — VANCOMYCIN HCL 500 MG IV SOLR
INTRAVENOUS | Status: AC
  Filled 2017-09-11: qty 500

## 2017-09-11 MED ORDER — ONDANSETRON HCL 4 MG/2ML IJ SOLN
INTRAMUSCULAR | Status: AC
  Filled 2017-09-11: qty 2

## 2017-09-11 MED ORDER — BUPIVACAINE-EPINEPHRINE 0.5% -1:200000 IJ SOLN
INTRAMUSCULAR | Status: DC | PRN
  Administered 2017-09-11: 6 mL

## 2017-09-11 MED ORDER — SODIUM CHLORIDE 0.9 % IV SOLN
INTRAVENOUS | Status: DC | PRN
  Administered 2017-09-11: 500 mL

## 2017-09-11 MED ORDER — MEPERIDINE HCL 25 MG/ML IJ SOLN
6.2500 mg | INTRAMUSCULAR | Status: DC | PRN
  Filled 2017-09-11: qty 1

## 2017-09-11 MED ORDER — HYDROCODONE-ACETAMINOPHEN 10-325 MG PO TABS: 1.0000 | ORAL_TABLET | ORAL | 0 refills | Status: DC | PRN

## 2017-09-11 MED ORDER — EPHEDRINE SULFATE-NACL 50-0.9 MG/10ML-% IV SOSY
PREFILLED_SYRINGE | INTRAVENOUS | Status: DC | PRN
  Administered 2017-09-11 (×4): 10 mg via INTRAVENOUS

## 2017-09-11 MED ORDER — LACTATED RINGERS IV SOLN
INTRAVENOUS | Status: DC
  Administered 2017-09-11 (×2): via INTRAVENOUS
  Filled 2017-09-11: qty 1000

## 2017-09-11 MED ORDER — FENTANYL CITRATE (PF) 100 MCG/2ML IJ SOLN
INTRAMUSCULAR | Status: DC | PRN
  Administered 2017-09-11 (×2): 25 ug via INTRAVENOUS

## 2017-09-11 MED ORDER — VANCOMYCIN HCL IN DEXTROSE 1-5 GM/200ML-% IV SOLN
1000.0000 mg | Freq: Once | INTRAVENOUS | Status: AC
  Administered 2017-09-11: 1000 mg via INTRAVENOUS
  Filled 2017-09-11 (×2): qty 200

## 2017-09-11 MED ORDER — SULFAMETHOXAZOLE-TRIMETHOPRIM 800-160 MG PO TABS: 1.0000 | ORAL_TABLET | Freq: Two times a day (BID) | ORAL | 0 refills | Status: DC

## 2017-09-11 MED ORDER — DEXAMETHASONE SODIUM PHOSPHATE 10 MG/ML IJ SOLN
INTRAMUSCULAR | Status: AC
  Filled 2017-09-11: qty 1

## 2017-09-11 MED ORDER — EPHEDRINE 5 MG/ML INJ
INTRAVENOUS | Status: AC
  Filled 2017-09-11: qty 10

## 2017-09-11 MED ORDER — ONDANSETRON HCL 4 MG/2ML IJ SOLN
INTRAMUSCULAR | Status: DC | PRN
  Administered 2017-09-11: 4 mg via INTRAVENOUS

## 2017-09-11 MED ORDER — DEXAMETHASONE SODIUM PHOSPHATE 4 MG/ML IJ SOLN
INTRAMUSCULAR | Status: DC | PRN
  Administered 2017-09-11: 4 mg via INTRAVENOUS

## 2017-09-11 MED ORDER — PROPOFOL 10 MG/ML IV BOLUS
INTRAVENOUS | Status: DC | PRN
  Administered 2017-09-11: 20 mg via INTRAVENOUS
  Administered 2017-09-11: 200 mg via INTRAVENOUS
  Administered 2017-09-11: 20 mg via INTRAVENOUS

## 2017-09-11 MED ORDER — FENTANYL CITRATE (PF) 100 MCG/2ML IJ SOLN
INTRAMUSCULAR | Status: AC
  Filled 2017-09-11: qty 2

## 2017-09-11 MED ORDER — MIDAZOLAM HCL 5 MG/5ML IJ SOLN
INTRAMUSCULAR | Status: DC | PRN
  Administered 2017-09-11: 1 mg via INTRAVENOUS

## 2017-09-11 MED ORDER — SODIUM CHLORIDE 0.9 % IV SOLN
INTRAVENOUS | Status: AC
  Filled 2017-09-11: qty 100

## 2017-09-11 SURGICAL SUPPLY — 86 items
APPLICATOR COTTON TIP 6IN STRL (MISCELLANEOUS) IMPLANT
BAG URINE DRAINAGE (UROLOGICAL SUPPLIES) IMPLANT
BANDAGE CO FLEX L/F 2IN X 5YD (GAUZE/BANDAGES/DRESSINGS) IMPLANT
BENZOIN TINCTURE PRP APPL 2/3 (GAUZE/BANDAGES/DRESSINGS) IMPLANT
BLADE CLIPPER SURG (BLADE) ×3 IMPLANT
BLADE EXTENDED COATED 6.5IN (ELECTRODE) IMPLANT
BLADE HEX COATED 2.75 (ELECTRODE) ×3 IMPLANT
BLADE SURG 10 STRL SS (BLADE) IMPLANT
BLADE SURG 15 STRL LF DISP TIS (BLADE) ×4 IMPLANT
BLADE SURG 15 STRL SS (BLADE) ×2
BNDG GAUZE ELAST 4 BULKY (GAUZE/BANDAGES/DRESSINGS) ×3 IMPLANT
CANISTER SUCTION 1200CC (MISCELLANEOUS) IMPLANT
CATH FOLEY 2WAY SLVR  5CC 16FR (CATHETERS)
CATH FOLEY 2WAY SLVR 5CC 16FR (CATHETERS) IMPLANT
CHLORAPREP W/TINT 26ML (MISCELLANEOUS) ×3 IMPLANT
CLEANER CAUTERY TIP 5X5 PAD (MISCELLANEOUS) ×2 IMPLANT
CLIPPER FILTER TUBING DISPOS (CLIP) ×3
CLOTH BEACON ORANGE TIMEOUT ST (SAFETY) ×3 IMPLANT
COVER BACK TABLE 60X90IN (DRAPES) ×3 IMPLANT
COVER MAYO STAND STRL (DRAPES) ×6 IMPLANT
DERMABOND ADVANCED (GAUZE/BANDAGES/DRESSINGS) ×1
DERMABOND ADVANCED .7 DNX12 (GAUZE/BANDAGES/DRESSINGS) ×2 IMPLANT
DISSECTOR ROUND CHERRY 3/8 STR (MISCELLANEOUS) IMPLANT
DRAIN PENROSE 18X1/2 LTX STRL (DRAIN) IMPLANT
DRAPE EXTREMITY T 121X128X90 (DRAPE) ×3 IMPLANT
DRAPE LAPAROTOMY 100X72 PEDS (DRAPES) ×3 IMPLANT
DRSG TEGADERM 4X4.75 (GAUZE/BANDAGES/DRESSINGS) IMPLANT
ELECT REM PT RETURN 9FT ADLT (ELECTROSURGICAL) ×3
ELECTRODE REM PT RTRN 9FT ADLT (ELECTROSURGICAL) ×2 IMPLANT
FILTER TUBING DISPOS CLIPPER (CLIP) ×2 IMPLANT
GAUZE SPONGE 4X4 12PLY STRL (GAUZE/BANDAGES/DRESSINGS) ×3 IMPLANT
GLOVE BIO SURGEON STRL SZ7 (GLOVE) ×6 IMPLANT
GLOVE BIO SURGEON STRL SZ8 (GLOVE) ×3 IMPLANT
GLOVE BIOGEL PI IND STRL 6.5 (GLOVE) ×4 IMPLANT
GLOVE BIOGEL PI IND STRL 7.5 (GLOVE) ×4 IMPLANT
GLOVE BIOGEL PI INDICATOR 6.5 (GLOVE) ×2
GLOVE BIOGEL PI INDICATOR 7.5 (GLOVE) ×2
GOWN STRL REUS W/ TWL LRG LVL3 (GOWN DISPOSABLE) ×4 IMPLANT
GOWN STRL REUS W/ TWL XL LVL3 (GOWN DISPOSABLE) ×2 IMPLANT
GOWN STRL REUS W/TWL LRG LVL3 (GOWN DISPOSABLE) ×2
GOWN STRL REUS W/TWL XL LVL3 (GOWN DISPOSABLE) ×1
HOLDER FOLEY CATH W/STRAP (MISCELLANEOUS) IMPLANT
IV NS IRRIG 3000ML ARTHROMATIC (IV SOLUTION) IMPLANT
KIT TURNOVER CYSTO (KITS) ×3 IMPLANT
MANIFOLD NEPTUNE II (INSTRUMENTS) ×3 IMPLANT
NEEDLE HYPO 22GX1.5 SAFETY (NEEDLE) IMPLANT
NEEDLE HYPO 25X1 1.5 SAFETY (NEEDLE) ×3 IMPLANT
NS IRRIG 500ML POUR BTL (IV SOLUTION) ×3 IMPLANT
PACK BASIN DAY SURGERY FS (CUSTOM PROCEDURE TRAY) ×3 IMPLANT
PAD CLEANER CAUTERY TIP 5X5 (MISCELLANEOUS) ×1
PENCIL BUTTON HOLSTER BLD 10FT (ELECTRODE) ×3 IMPLANT
PLUG CATH AND CAP STER (CATHETERS) IMPLANT
RETRACTOR WILSON SYSTEM (INSTRUMENTS) IMPLANT
SPONGE GAUZE 4X4 12PLY STER LF (GAUZE/BANDAGES/DRESSINGS) ×3 IMPLANT
SPONGE INTESTINAL PEANUT (DISPOSABLE) IMPLANT
SPONGE LAP 18X18 X RAY DECT (DISPOSABLE) IMPLANT
SPONGE LAP 4X18 X RAY DECT (DISPOSABLE) ×3 IMPLANT
STRIP CLOSURE SKIN 1/2X4 (GAUZE/BANDAGES/DRESSINGS) IMPLANT
STRIP CLOSURE SKIN 1/4X4 (GAUZE/BANDAGES/DRESSINGS) IMPLANT
SUPPORT SCROTAL LG STRP (MISCELLANEOUS) ×3 IMPLANT
SURGILUBE 2OZ TUBE FLIPTOP (MISCELLANEOUS) ×3 IMPLANT
SUT CHROMIC 3 0 SH 27 (SUTURE) ×6 IMPLANT
SUT MNCRL AB 4-0 PS2 18 (SUTURE) IMPLANT
SUT PDS AB 2-0 CT2 27 (SUTURE) ×12 IMPLANT
SUT SILK 2 0 SH (SUTURE) IMPLANT
SUT SILK 2 0 TIES 17X18 (SUTURE)
SUT SILK 2-0 18XBRD TIE BLK (SUTURE) IMPLANT
SUT SILK 3 0 SH 30 (SUTURE) IMPLANT
SUT VIC AB 0 SH 27 (SUTURE) IMPLANT
SUT VIC AB 2-0 CT2 27 (SUTURE) IMPLANT
SUT VIC AB 2-0 UR6 27 (SUTURE) IMPLANT
SUT VIC AB 3-0 SH 27 (SUTURE)
SUT VIC AB 3-0 SH 27X BRD (SUTURE) IMPLANT
SUT VICRYL 0 UR6 27IN ABS (SUTURE) IMPLANT
SUT VICRYL 4-0 PS2 18IN ABS (SUTURE) IMPLANT
SYR 20CC LL (SYRINGE) ×3 IMPLANT
SYR 50ML LL SCALE MARK (SYRINGE) ×6 IMPLANT
SYR BULB IRRIGATION 50ML (SYRINGE) ×3 IMPLANT
SYR CONTROL 10ML LL (SYRINGE) ×3 IMPLANT
SYRINGE 10CC LL (SYRINGE) ×3 IMPLANT
TOWEL OR 17X24 6PK STRL BLUE (TOWEL DISPOSABLE) ×6 IMPLANT
TRAY DSU PREP LF (CUSTOM PROCEDURE TRAY) ×3 IMPLANT
TUBE CONNECTING 12X1/4 (SUCTIONS) ×3 IMPLANT
WATER STERILE IRR 3000ML UROMA (IV SOLUTION) IMPLANT
WATER STERILE IRR 500ML POUR (IV SOLUTION) ×3 IMPLANT
YANKAUER SUCT BULB TIP NO VENT (SUCTIONS) ×3 IMPLANT

## 2017-09-11 NOTE — Anesthesia Procedure Notes (Signed)
Procedure Name: LMA Insertion Date/Time: 09/11/2017 9:10 AM Performed by: Janeece Riggers, MD Pre-anesthesia Checklist: Patient identified, Emergency Drugs available, Suction available and Patient being monitored Patient Re-evaluated:Patient Re-evaluated prior to induction Oxygen Delivery Method: Circle system utilized Preoxygenation: Pre-oxygenation with 100% oxygen Induction Type: IV induction Ventilation: Mask ventilation without difficulty LMA: LMA inserted LMA Size: 4.0 Number of attempts: 1 Airway Equipment and Method: Bite block Placement Confirmation: positive ETCO2 Tube secured with: Tape Dental Injury: Teeth and Oropharynx as per pre-operative assessment

## 2017-09-11 NOTE — Transfer of Care (Signed)
Last Vitals:  Vitals:   09/11/17 0649 09/11/17 1019  BP: (!) 141/67   Pulse: (!) 47   Resp: 16   Temp: 36.6 C (P) 36.5 C  SpO2: 100%     Last Pain:  Vitals:   09/11/17 0649  TempSrc: Oral        Immediate Anesthesia Transfer of Care Note  Patient: Jeffery Vasquez  Procedure(s) Performed: Procedure(s) (LRB): SCROTUM EXPLORATION, REPAIR OF MALFUNCTIONING IMPLANT (N/A)  Patient Location: PACU  Anesthesia Type: General  Level of Consciousness: awake, alert  and oriented  Airway & Oxygen Therapy: Patient Spontanous Breathing and Patient connected to nasal cannula oxygen  Post-op Assessment: Report given to PACU RN and Post -op Vital signs reviewed and stable  Post vital signs: Reviewed and stable  Complications: No apparent anesthesia complications

## 2017-09-11 NOTE — Discharge Instructions (Signed)
Scrotal surgery postoperative instructions  Wound:  In most cases your incision will have absorbable sutures that will dissolve within the first 10-20 days. Some will fall out even earlier. Expect some redness as the sutures dissolved but this should occur only around the sutures. If there is generalized redness, especially with increasing pain or swelling, let us know. The scrotum may very likely get "black and blue" as the blood in the tissues spread. Sometimes the whole scrotum will turn colors. The black and blue is followed by a yellow and brown color. In time, all the discoloration will go away. In some cases some firm swelling in the area of the testicle may persist for up to 4-6 weeks after the surgery and is considered normal in most cases.  Diet:  You may return to your normal diet within 24 hours following your surgery. You may note some mild nausea and possibly vomiting the first 6-8 hours following surgery. This is usually due to the side effects of anesthesia, and will disappear quite soon. I would suggest clear liquids and a very light meal the first evening following your surgery.  Activity:  Your physical activity should be restricted the first 48 hours. During that time you should remain relatively inactive, moving about only when necessary. During the first 7-10 days following surgery he should avoid lifting any heavy objects (anything greater than 15 pounds), and avoid strenuous exercise. If you work, ask [place] specifically about your restrictions, both for work and home. We will write a note to your employer if needed.  You should plan to wear a tight pair of jockey shorts or an athletic supporter for the first 4-5 days, even to sleep. This will keep the scrotum immobilized to some degree and keep the swelling down.  I would recommend not manipulating the pump until the incision has completely healed and the sutures have all fallen off.  Ice packs should be placed on and off over  the scrotum for the first 48 hours. Frozen peas or corn in a ZipLock bag can be frozen, used and re-frozen. Fifteen minutes on and 15 minutes off is a reasonable schedule. The ice is a good pain reliever and keeps the swelling down.  Hygiene:  You may shower 48 hours after your surgery. Tub bathing should be restricted until the seventh day.  Medication:  You will be sent home with some type of pain medication. In many cases you will be sent home with a narcotic pain pill (hydrococone or oxycodone). If the pain is not too bad, you may take either Tylenol (acetaminophen) or Advil (ibuprofen) which contain no narcotic agents, and might be tolerated a little better, with fewer side effects. If the pain medication you are sent home with does not control the pain, you will have to let us know. Some narcotic pain medications cannot be given or refilled by a phone call to a pharmacy.  Problems you should report to [place]:   Fever of 101.0 degrees Fahrenheit or greater.  Moderate or severe swelling under the skin incision or involving the scrotum.  Drug reaction such as hives, a rash, nausea or vomiting.    Post Anesthesia Home Care Instructions  Activity: Get plenty of rest for the remainder of the day. A responsible individual must stay with you for 24 hours following the procedure.  For the next 24 hours, DO NOT: -Drive a car -Paediatric nurse -Drink alcoholic beverages -Take any medication unless instructed by your physician -Make any legal decisions  or sign important papers.  Meals: Start with liquid foods such as gelatin or soup. Progress to regular foods as tolerated. Avoid greasy, spicy, heavy foods. If nausea and/or vomiting occur, drink only clear liquids until the nausea and/or vomiting subsides. Call your physician if vomiting continues.  Special Instructions/Symptoms: Your throat may feel dry or sore from the anesthesia or the breathing tube placed in your throat during surgery.  If this causes discomfort, gargle with warm salt water. The discomfort should disappear within 24 hours.  If you had a scopolamine patch placed behind your ear for the management of post- operative nausea and/or vomiting:  1. The medication in the patch is effective for 72 hours, after which it should be removed.  Wrap patch in a tissue and discard in the trash. Wash hands thoroughly with soap and water. 2. You may remove the patch earlier than 72 hours if you experience unpleasant side effects which may include dry mouth, dizziness or visual disturbances. 3. Avoid touching the patch. Wash your hands with soap and water after contact with the patch.

## 2017-09-11 NOTE — Anesthesia Postprocedure Evaluation (Signed)
Anesthesia Post Note  Patient: Jeffery Vasquez  Procedure(s) Performed: SCROTUM EXPLORATION, REPAIR OF MALFUNCTIONING IMPLANT (N/A Scrotum)     Patient location during evaluation: PACU Anesthesia Type: General Level of consciousness: awake and alert Pain management: pain level controlled Vital Signs Assessment: post-procedure vital signs reviewed and stable Respiratory status: spontaneous breathing, nonlabored ventilation, respiratory function stable and patient connected to nasal cannula oxygen Cardiovascular status: blood pressure returned to baseline and stable Postop Assessment: no apparent nausea or vomiting Anesthetic complications: no    Last Vitals:  Vitals:   09/11/17 0649 09/11/17 1019  BP: (!) 141/67 (!) 162/73  Pulse: (!) 47 83  Resp: 16 20  Temp: 36.6 C 36.5 C  SpO2: 100% 100%    Last Pain:  Vitals:   09/11/17 0649  TempSrc: Oral                 Murray Durrell

## 2017-09-11 NOTE — Op Note (Signed)
PATIENT:  Jeffery Vasquez  PRE-OPERATIVE DIAGNOSIS: Malfunctioning penile prosthesis  POST-OPERATIVE DIAGNOSIS: Same  PROCEDURE: 1.  Scrotal exploration. 2.  Repair of malfunctioning prosthesis pump  SURGEON:  Claybon Jabs  INDICATION: MICAHEL Vasquez is a 72 year old male who had an inflatable penile prosthesis implanted and this device appeared to be working properly initially but then developed an inability to inflate the device.  When he was examined I found that I was unable to depress the inflation bulb.  Multiple maneuvers including deforming the deflation valve, pumping the device with the deflation valve activated and multiple attempts at trying to use pressure to overcome a possible malfunctioning valve were unsuccessful.  We therefore discussed proceeding with surgical exploration and repair/replacement of any defective parts.  ANESTHESIA:  General  EBL:  Minimal  DRAINS: None  LOCAL MEDICATIONS USED: 1/2 percent Marcaine with epinephrine none  SPECIMEN:    Description of procedure: After informed consent the patient was taken to the operating room and placed on the table in a supine position. General anesthesia was then administered. Once fully anesthetized the patient was moved to the dorsal lithotomy position and the genitalia were sterilely prepped and draped in standard fashion. An official timeout was then performed.  A midline scrotal incision was made and then the Bovie was used to cut down on the tubing.  This was delivered and the wound was irrigated with antibiotic solution.  I then delivered the pump.  I noted no kinking of the tubing.  There was no evidence of infection or fluid surrounding the pump or tubing.  Once the pump was delivered inspection revealed what appeared to be intact.  I initially tried using a great deal of pressure to overcome potentially stuck valve and eventually was able to do so.  This resulted in the pump functioning properly and it was used  to inflate and deflate the prosthesis multiple times to assure proper functioning.  Because it was functioning properly and appeared to be in good working order I did not feel replacement was necessary.  I therefore irrigated the scrotum again with antibiotic solution, excised the capsule that had enclosed the pump and cauterized all bleeding points.  I then replaced the pump in the dependent portion of his right hemiscrotum.  A 3-0 chromic suture was then used to secure the pump in this position.  I then used 3-0 chromic to close the deep scrotal tissue in a running fashion.  Local anesthetic was injected in the subcutaneous tissue and the skin was closed with a running 3-0 chromic suture.  Dermabond was applied to the suture line and after this dried I applied a sterile gauze dressing with fluffed gauze and a scrotal support.  The patient tolerated the procedure well and there were no intraoperative complications.  Needle, sponge and instrument counts were correct x2 at the end of the operation.  PLAN OF CARE: Discharge to home after PACU  PATIENT DISPOSITION:  PACU - hemodynamically stable.

## 2017-09-12 ENCOUNTER — Encounter (HOSPITAL_BASED_OUTPATIENT_CLINIC_OR_DEPARTMENT_OTHER): Payer: Self-pay | Admitting: Urology

## 2017-09-12 LAB — POCT HEMOGLOBIN-HEMACUE: Hemoglobin: 15.2 g/dL (ref 13.0–17.0)

## 2017-09-20 ENCOUNTER — Encounter: Payer: Self-pay | Admitting: Family Medicine

## 2017-09-20 ENCOUNTER — Ambulatory Visit (INDEPENDENT_AMBULATORY_CARE_PROVIDER_SITE_OTHER): Payer: Medicare Other | Admitting: Family Medicine

## 2017-09-20 VITALS — BP 150/78 | HR 64 | Temp 98.2°F | Resp 14 | Wt 182.8 lb

## 2017-09-20 DIAGNOSIS — M795 Residual foreign body in soft tissue: Secondary | ICD-10-CM | POA: Diagnosis not present

## 2017-09-20 DIAGNOSIS — I10 Essential (primary) hypertension: Secondary | ICD-10-CM | POA: Diagnosis not present

## 2017-09-20 NOTE — Assessment & Plan Note (Signed)
DASH guidelines; patient was sure that he did not want pharmacologic intervention for his high blood pressure, even though I offered medicine
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
## 2017-09-20 NOTE — Patient Instructions (Addendum)
Try to follow the DASH guidelines (DASH stands for Dietary Approaches to Stop Hypertension). Try to limit the sodium in your diet to no more than 1,500mg  of sodium per day. Certainly try to not exceed 2,000 mg per day at the very most. Do not add salt when cooking or at the table.  Check the sodium amount on labels when shopping, and choose items lower in sodium when given a choice. Avoid or limit foods that already contain a lot of sodium. Eat a diet rich in fruits and vegetables and whole grains, and try to lose weight if overweight or obese  Keep an eye on that finger; use antibacterial ointment and keep that site clean and covered   DASH Eating Plan DASH stands for "Dietary Approaches to Stop Hypertension." The DASH eating plan is a healthy eating plan that has been shown to reduce high blood pressure (hypertension). It may also reduce your risk for type 2 diabetes, heart disease, and stroke. The DASH eating plan may also help with weight loss. What are tips for following this plan? General guidelines  Avoid eating more than 2,300 mg (milligrams) of salt (sodium) a day. If you have hypertension, you may need to reduce your sodium intake to 1,500 mg a day.  Limit alcohol intake to no more than 1 drink a day for nonpregnant women and 2 drinks a day for men. One drink equals 12 oz of beer, 5 oz of wine, or 1 oz of hard liquor.  Work with your health care provider to maintain a healthy body weight or to lose weight. Ask what an ideal weight is for you.  Get at least 30 minutes of exercise that causes your heart to beat faster (aerobic exercise) most days of the week. Activities may include walking, swimming, or biking.  Work with your health care provider or diet and nutrition specialist (dietitian) to adjust your eating plan to your individual calorie needs. Reading food labels  Check food labels for the amount of sodium per serving. Choose foods with less than 5 percent of the Daily Value of  sodium. Generally, foods with less than 300 mg of sodium per serving fit into this eating plan.  To find whole grains, look for the word "whole" as the first word in the ingredient list. Shopping  Buy products labeled as "low-sodium" or "no salt added."  Buy fresh foods. Avoid canned foods and premade or frozen meals. Cooking  Avoid adding salt when cooking. Use salt-free seasonings or herbs instead of table salt or sea salt. Check with your health care provider or pharmacist before using salt substitutes.  Do not fry foods. Cook foods using healthy methods such as baking, boiling, grilling, and broiling instead.  Cook with heart-healthy oils, such as olive, canola, soybean, or sunflower oil. Meal planning   Eat a balanced diet that includes: ? 5 or more servings of fruits and vegetables each day. At each meal, try to fill half of your plate with fruits and vegetables. ? Up to 6-8 servings of whole grains each day. ? Less than 6 oz of lean meat, poultry, or fish each day. A 3-oz serving of meat is about the same size as a deck of cards. One egg equals 1 oz. ? 2 servings of low-fat dairy each day. ? A serving of nuts, seeds, or beans 5 times each week. ? Heart-healthy fats. Healthy fats called Omega-3 fatty acids are found in foods such as flaxseeds and coldwater fish, like sardines, salmon, and  mackerel.  Limit how much you eat of the following: ? Canned or prepackaged foods. ? Food that is high in trans fat, such as fried foods. ? Food that is high in saturated fat, such as fatty meat. ? Sweets, desserts, sugary drinks, and other foods with added sugar. ? Full-fat dairy products.  Do not salt foods before eating.  Try to eat at least 2 vegetarian meals each week.  Eat more home-cooked food and less restaurant, buffet, and fast food.  When eating at a restaurant, ask that your food be prepared with less salt or no salt, if possible. What foods are recommended? The items  listed may not be a complete list. Talk with your dietitian about what dietary choices are best for you. Grains Whole-grain or whole-wheat bread. Whole-grain or whole-wheat pasta. Brown rice. [place] Morrow. Bulgur. Whole-grain and low-sodium cereals. Pita bread. Low-fat, low-sodium crackers. Whole-wheat flour tortillas. Vegetables Fresh or frozen vegetables (raw, steamed, roasted, or grilled). Low-sodium or reduced-sodium tomato and vegetable juice. Low-sodium or reduced-sodium tomato sauce and tomato paste. Low-sodium or reduced-sodium canned vegetables. Fruits All fresh, dried, or frozen fruit. Canned fruit in natural juice (without added sugar). Meat and other protein foods Skinless chicken or [place]. Ground chicken or [place]. Pork with fat trimmed off. Fish and seafood. Egg whites. Dried beans, peas, or lentils. Unsalted nuts, nut butters, and seeds. Unsalted canned beans. Lean cuts of beef with fat trimmed off. Low-sodium, lean deli meat. Dairy Low-fat (1%) or fat-free (skim) milk. Fat-free, low-fat, or reduced-fat cheeses. Nonfat, low-sodium ricotta or cottage cheese. Low-fat or nonfat yogurt. Low-fat, low-sodium cheese. Fats and oils Soft margarine without trans fats. Vegetable oil. Low-fat, reduced-fat, or light mayonnaise and salad dressings (reduced-sodium). Canola, safflower, olive, soybean, and sunflower oils. Avocado. Seasoning and other foods Herbs. Spices. Seasoning mixes without salt. Unsalted popcorn and pretzels. Fat-free sweets. What foods are not recommended? The items listed may not be a complete list. Talk with your dietitian about what dietary choices are best for you. Grains Baked goods made with fat, such as croissants, muffins, or some breads. Dry pasta or rice meal packs. Vegetables Creamed or fried vegetables. Vegetables in a cheese sauce. Regular canned vegetables (not low-sodium or reduced-sodium). Regular canned tomato sauce and paste (not low-sodium or  reduced-sodium). Regular tomato and vegetable juice (not low-sodium or reduced-sodium). Angie Fava. Olives. Fruits Canned fruit in a light or heavy syrup. Fried fruit. Fruit in cream or butter sauce. Meat and other protein foods Fatty cuts of meat. Ribs. Fried meat. Berniece Salines. Sausage. Bologna and other processed lunch meats. Salami. Fatback. Hotdogs. Bratwurst. Salted nuts and seeds. Canned beans with added salt. Canned or smoked fish. Whole eggs or egg yolks. Chicken or [place] with skin. Dairy Whole or 2% milk, cream, and half-and-half. Whole or full-fat cream cheese. Whole-fat or sweetened yogurt. Full-fat cheese. Nondairy creamers. Whipped toppings. Processed cheese and cheese spreads. Fats and oils Butter. Stick margarine. Lard. Shortening. Ghee. Bacon fat. Tropical oils, such as coconut, palm kernel, or palm oil. Seasoning and other foods Salted popcorn and pretzels. Onion salt, garlic salt, seasoned salt, table salt, and sea salt. Worcestershire sauce. Tartar sauce. Barbecue sauce. Teriyaki sauce. Soy sauce, including reduced-sodium. Steak sauce. Canned and packaged gravies. Fish sauce. Oyster sauce. Cocktail sauce. Horseradish that you find on the shelf. Ketchup. Mustard. Meat flavorings and tenderizers. Bouillon cubes. Hot sauce and Tabasco sauce. Premade or packaged marinades. Premade or packaged taco seasonings. Relishes. Regular salad dressings. Where to find more information:  National Heart, Lung,  and Blood Institute: https://wilson-eaton.com/  American Heart Association: www.heart.org Summary  The DASH eating plan is a healthy eating plan that has been shown to reduce high blood pressure (hypertension). It may also reduce your risk for type 2 diabetes, heart disease, and stroke.  With the DASH eating plan, you should limit salt (sodium) intake to 2,300 mg a day. If you have hypertension, you may need to reduce your sodium intake to 1,500 mg a day.  When on the DASH eating plan, aim to eat more  fresh fruits and vegetables, whole grains, lean proteins, low-fat dairy, and heart-healthy fats.  Work with your health care provider or diet and nutrition specialist (dietitian) to adjust your eating plan to your individual calorie needs. This information is not intended to replace advice given to you by your health care provider. Make sure you discuss any questions you have with your health care provider. Document Released: 06/09/2011 Document Revised: 06/13/2016 Document Reviewed: 06/13/2016 Elsevier Interactive Patient Education  [employer].

## 2017-09-25 DIAGNOSIS — N5201 Erectile dysfunction due to arterial insufficiency: Secondary | ICD-10-CM | POA: Diagnosis not present

## 2017-10-27 ENCOUNTER — Other Ambulatory Visit: Payer: Self-pay

## 2017-10-27 ENCOUNTER — Encounter: Payer: Self-pay | Admitting: Emergency Medicine

## 2017-10-27 DIAGNOSIS — B349 Viral infection, unspecified: Secondary | ICD-10-CM | POA: Diagnosis not present

## 2017-10-27 DIAGNOSIS — R05 Cough: Secondary | ICD-10-CM | POA: Diagnosis not present

## 2017-10-27 DIAGNOSIS — I10 Essential (primary) hypertension: Secondary | ICD-10-CM | POA: Diagnosis not present

## 2017-10-27 DIAGNOSIS — Z7982 Long term (current) use of aspirin: Secondary | ICD-10-CM | POA: Insufficient documentation

## 2017-10-27 DIAGNOSIS — J111 Influenza due to unidentified influenza virus with other respiratory manifestations: Secondary | ICD-10-CM | POA: Insufficient documentation

## 2017-10-27 DIAGNOSIS — R0789 Other chest pain: Secondary | ICD-10-CM | POA: Diagnosis not present

## 2017-10-27 NOTE — ED Triage Notes (Signed)
Patient with complaint of cough, congestion and body aches that started 2 days ago and became worse today.

## 2017-10-28 ENCOUNTER — Emergency Department
Admission: EM | Admit: 2017-10-28 | Discharge: 2017-10-28 | Disposition: A | Discharge status: Home / Self Care | Payer: Medicare Other | Attending: Emergency Medicine | Admitting: Emergency Medicine

## 2017-10-28 ENCOUNTER — Emergency Department: Payer: Medicare Other

## 2017-10-28 DIAGNOSIS — R05 Cough: Secondary | ICD-10-CM | POA: Diagnosis not present

## 2017-10-28 DIAGNOSIS — B349 Viral infection, unspecified: Secondary | ICD-10-CM

## 2017-10-28 DIAGNOSIS — J111 Influenza due to unidentified influenza virus with other respiratory manifestations: Secondary | ICD-10-CM | POA: Diagnosis not present

## 2017-10-28 DIAGNOSIS — R6889 Other general symptoms and signs: Secondary | ICD-10-CM

## 2017-10-28 LAB — INFLUENZA PANEL BY PCR (TYPE A & B)
INFLAPCR: NEGATIVE
INFLBPCR: NEGATIVE

## 2017-10-28 LAB — CBC
HCT: 44.7 % (ref 40.0–52.0)
HEMOGLOBIN: 15.1 g/dL (ref 13.0–18.0)
MCH: 30.1 pg (ref 26.0–34.0)
MCHC: 33.8 g/dL (ref 32.0–36.0)
MCV: 88.9 fL (ref 80.0–100.0)
Platelets: 117 10*3/uL — ABNORMAL LOW (ref 150–440)
RBC: 5.02 MIL/uL (ref 4.40–5.90)
RDW: 14.5 % (ref 11.5–14.5)
WBC: 5.9 10*3/uL (ref 3.8–10.6)

## 2017-10-28 LAB — TROPONIN I

## 2017-10-28 LAB — BASIC METABOLIC PANEL
Anion gap: 7 (ref 5–15)
BUN: 12 mg/dL (ref 6–20)
CO2: 25 mmol/L (ref 22–32)
CREATININE: 0.89 mg/dL (ref 0.61–1.24)
Calcium: 8.3 mg/dL — ABNORMAL LOW (ref 8.9–10.3)
Chloride: 104 mmol/L (ref 101–111)
GFR calc Af Amer: >60 mL/min (ref >60)
GLUCOSE: 118 mg/dL — AB (ref 65–99)
POTASSIUM: 3.9 mmol/L (ref 3.5–5.1)
Sodium: 136 mmol/L (ref 135–145)

## 2017-10-28 MED ORDER — IBUPROFEN 600 MG PO TABS
ORAL_TABLET | ORAL | Status: AC
  Filled 2017-10-28: qty 1

## 2017-10-28 MED ORDER — ALBUTEROL SULFATE (2.5 MG/3ML) 0.083% IN NEBU
2.5000 mg | INHALATION_SOLUTION | Freq: Once | RESPIRATORY_TRACT | Status: AC
Start: 2017-10-28 — End: 2017-10-28
  Administered 2017-10-28: 2.5 mg via RESPIRATORY_TRACT
  Filled 2017-10-28: qty 3

## 2017-10-28 MED ORDER — IBUPROFEN 600 MG PO TABS
600.0000 mg | ORAL_TABLET | Freq: Once | ORAL | Status: AC
  Administered 2017-10-28: 600 mg via ORAL

## 2017-10-28 MED ORDER — BENZONATATE 100 MG PO CAPS: 100.0000 mg | ORAL_CAPSULE | Freq: Four times a day (QID) | ORAL | 0 refills | Status: DC | PRN

## 2017-10-28 MED ORDER — FLUTICASONE PROPIONATE 50 MCG/ACT NA SUSP: 1.0000 | Freq: Every day | NASAL | 0 refills | Status: DC

## 2017-10-28 NOTE — Discharge Instructions (Addendum)
Please follow up with your primary care physician. Please take tylenol or ibuprofen for feelings of chills or body aches. Please return with any worsened condition or any other concern
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
## 2017-10-28 NOTE — ED Notes (Signed)
ED Provider at bedside. 

## 2017-10-28 NOTE — ED Notes (Signed)
Pt states that this afternoon he was out celebrating his anniversary when he started feeling very sick. He had "burning" in his lungs, aching all over, HA, and his chest just hurts. Pt is alert and oriented x 4.

## 2017-11-20 DIAGNOSIS — H16223 Keratoconjunctivitis sicca, not specified as Sjogren's, bilateral: Secondary | ICD-10-CM | POA: Diagnosis not present

## 2017-12-13 DIAGNOSIS — J019 Acute sinusitis, unspecified: Secondary | ICD-10-CM | POA: Diagnosis not present

## 2017-12-26 ENCOUNTER — Ambulatory Visit (INDEPENDENT_AMBULATORY_CARE_PROVIDER_SITE_OTHER): Payer: Medicare Other | Admitting: Family Medicine

## 2017-12-26 ENCOUNTER — Encounter: Payer: Self-pay | Admitting: Family Medicine

## 2017-12-26 VITALS — BP 122/78 | HR 65 | Temp 98.1°F | Resp 12 | Ht 67.0 in | Wt 175.3 lb

## 2017-12-26 DIAGNOSIS — I10 Essential (primary) hypertension: Secondary | ICD-10-CM

## 2017-12-26 DIAGNOSIS — R300 Dysuria: Secondary | ICD-10-CM | POA: Diagnosis not present

## 2017-12-26 DIAGNOSIS — R350 Frequency of micturition: Secondary | ICD-10-CM | POA: Diagnosis not present

## 2017-12-26 DIAGNOSIS — L814 Other melanin hyperpigmentation: Secondary | ICD-10-CM | POA: Diagnosis not present

## 2017-12-26 DIAGNOSIS — D229 Melanocytic nevi, unspecified: Secondary | ICD-10-CM | POA: Diagnosis not present

## 2017-12-26 DIAGNOSIS — L821 Other seborrheic keratosis: Secondary | ICD-10-CM | POA: Diagnosis not present

## 2017-12-26 NOTE — Patient Instructions (Signed)
We'll check the urine today We'll see if more antibiotics are needed
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
## 2017-12-26 NOTE — Assessment & Plan Note (Signed)
Excellent control.   

## 2017-12-27 ENCOUNTER — Other Ambulatory Visit: Payer: Self-pay | Admitting: Family Medicine

## 2017-12-27 MED ORDER — CIPROFLOXACIN HCL 500 MG PO TABS: 500.0000 mg | ORAL_TABLET | Freq: Two times a day (BID) | ORAL | 0 refills | Status: DC

## 2017-12-27 NOTE — Progress Notes (Signed)
Start back on cipro; culture pending, fax to urologist

## 2017-12-28 ENCOUNTER — Telehealth: Payer: Self-pay | Admitting: Family Medicine

## 2017-12-28 LAB — URINALYSIS W MICROSCOPIC + REFLEX CULTURE
BACTERIA UA: NONE SEEN /HPF
BILIRUBIN URINE: NEGATIVE
GLUCOSE, UA: NEGATIVE
HYALINE CAST: NONE SEEN /LPF
Ketones, ur: NEGATIVE
Leukocyte Esterase: NEGATIVE
NITRITES URINE, INITIAL: NEGATIVE
Specific Gravity, Urine: 1.027 (ref 1.001–1.03)
Squamous Epithelial / LPF: NONE SEEN /HPF (ref <=5)
pH: <=5 (ref 5.0–8.0)

## 2017-12-28 LAB — URINE CULTURE
MICRO NUMBER: 90762904
Result:: NO GROWTH
SPECIMEN QUALITY: ADEQUATE

## 2017-12-28 LAB — CULTURE INDICATED

## 2017-12-28 NOTE — Telephone Encounter (Unsigned)
Copied from [place] [phone]. Topic: General - Other >> Dec 28, 2017 12:59 PM Mcneil, Ja-Kwan wrote: Reason for CRM: Pt returned call for lab results. Pt requests a call back. Cb# [phone]

## 2017-12-29 NOTE — Telephone Encounter (Signed)
Results given and documented in result note. 

## 2018-01-03 DIAGNOSIS — R310 Gross hematuria: Secondary | ICD-10-CM | POA: Diagnosis not present

## 2018-01-03 DIAGNOSIS — R972 Elevated prostate specific antigen [PSA]: Secondary | ICD-10-CM | POA: Diagnosis not present

## 2018-01-12 DIAGNOSIS — R3129 Other microscopic hematuria: Secondary | ICD-10-CM | POA: Diagnosis not present

## 2018-01-12 DIAGNOSIS — R310 Gross hematuria: Secondary | ICD-10-CM | POA: Diagnosis not present

## 2018-01-16 DIAGNOSIS — R972 Elevated prostate specific antigen [PSA]: Secondary | ICD-10-CM | POA: Diagnosis not present

## 2018-01-16 DIAGNOSIS — N210 Calculus in bladder: Secondary | ICD-10-CM | POA: Diagnosis not present

## 2018-01-16 DIAGNOSIS — R310 Gross hematuria: Secondary | ICD-10-CM | POA: Diagnosis not present

## 2018-06-12 ENCOUNTER — Telehealth: Payer: Self-pay

## 2018-06-12 NOTE — Telephone Encounter (Signed)
Left vm advising pt per colonoscopy report and results letter, pt is not due for a repeat colonoscopy until 12/2019. See colonoscopy report and results review letter.

## 2018-06-12 NOTE — Telephone Encounter (Signed)
Pt wai

## 2018-06-12 NOTE — Telephone Encounter (Signed)
Would like to schedule a colon test. Pt says Dr. Allen Norris said  to come back in 42yrs and have test repeated. Pt does not want to come in for a consult.

## 2018-07-23 DIAGNOSIS — R972 Elevated prostate specific antigen [PSA]: Secondary | ICD-10-CM | POA: Diagnosis not present

## 2018-08-19 ENCOUNTER — Other Ambulatory Visit: Payer: Self-pay

## 2018-08-19 ENCOUNTER — Encounter: Payer: Self-pay | Admitting: Emergency Medicine

## 2018-08-19 ENCOUNTER — Ambulatory Visit
Admission: EM | Admit: 2018-08-19 | Discharge: 2018-08-19 | Disposition: A | Discharge status: Home / Self Care | Payer: Medicare Other | Attending: Emergency Medicine | Admitting: Emergency Medicine

## 2018-08-19 DIAGNOSIS — K120 Recurrent oral aphthae: Secondary | ICD-10-CM | POA: Diagnosis not present

## 2018-08-19 DIAGNOSIS — J069 Acute upper respiratory infection, unspecified: Secondary | ICD-10-CM | POA: Diagnosis not present

## 2018-08-19 LAB — RAPID INFLUENZA A&B ANTIGENS (ARMC ONLY)
INFLUENZA A (ARMC): NEGATIVE
INFLUENZA B (ARMC): NEGATIVE

## 2018-08-19 MED ORDER — IPRATROPIUM BROMIDE 0.06 % NA SOLN: 2.0000 | Freq: Four times a day (QID) | NASAL | 0 refills | Status: DC

## 2018-08-19 MED ORDER — TRIAMCINOLONE ACETONIDE 0.1 % MT PSTE: 1.0000 "application " | PASTE | Freq: Two times a day (BID) | OROMUCOSAL | 0 refills | Status: DC

## 2018-08-19 MED ORDER — BENZONATATE 200 MG PO CAPS: 200.0000 mg | ORAL_CAPSULE | Freq: Three times a day (TID) | ORAL | 0 refills | Status: DC | PRN

## 2018-08-19 NOTE — Discharge Instructions (Addendum)
Atrovent nasal spray, saline nasal irrigation with a Milta Deiters med sinus rinse and distilled water as often as you want to help with the nasal congestion, Tessalon cough, 400 mg ibuprofen combined with 500 mg of Tylenol 3-4 times a day as needed for body aches, triamcinolone ointment for the aphthous ulcer.

## 2018-08-19 NOTE — ED Triage Notes (Signed)
Patient c/o cough, sinus congestion, bodyaches, and headaches that started Friday.
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
## 2018-10-01 DIAGNOSIS — N529 Male erectile dysfunction, unspecified: Secondary | ICD-10-CM | POA: Diagnosis not present

## 2018-10-01 DIAGNOSIS — R310 Gross hematuria: Secondary | ICD-10-CM | POA: Diagnosis not present

## 2018-10-01 DIAGNOSIS — R972 Elevated prostate specific antigen [PSA]: Secondary | ICD-10-CM | POA: Diagnosis not present

## 2018-10-17 DIAGNOSIS — H2513 Age-related nuclear cataract, bilateral: Secondary | ICD-10-CM | POA: Diagnosis not present

## 2018-10-17 DIAGNOSIS — H02209 Unspecified lagophthalmos unspecified eye, unspecified eyelid: Secondary | ICD-10-CM | POA: Diagnosis not present

## 2018-10-27 IMAGING — CR DG CHEST 2V
1 series · 2 of 2 positions shown · non-contrast
Comparison: CT chest 05/15/2017.  Chest 08/29/2016

CLINICAL DATA: Cough, congestion, and body aches starting 2 days
ago. Worse today.

EXAM:
CHEST - 2 VIEW

[Series 1: dg chest 2 view · 0.14mm/px · 2 of 2 slices shown]
[im 1/2]
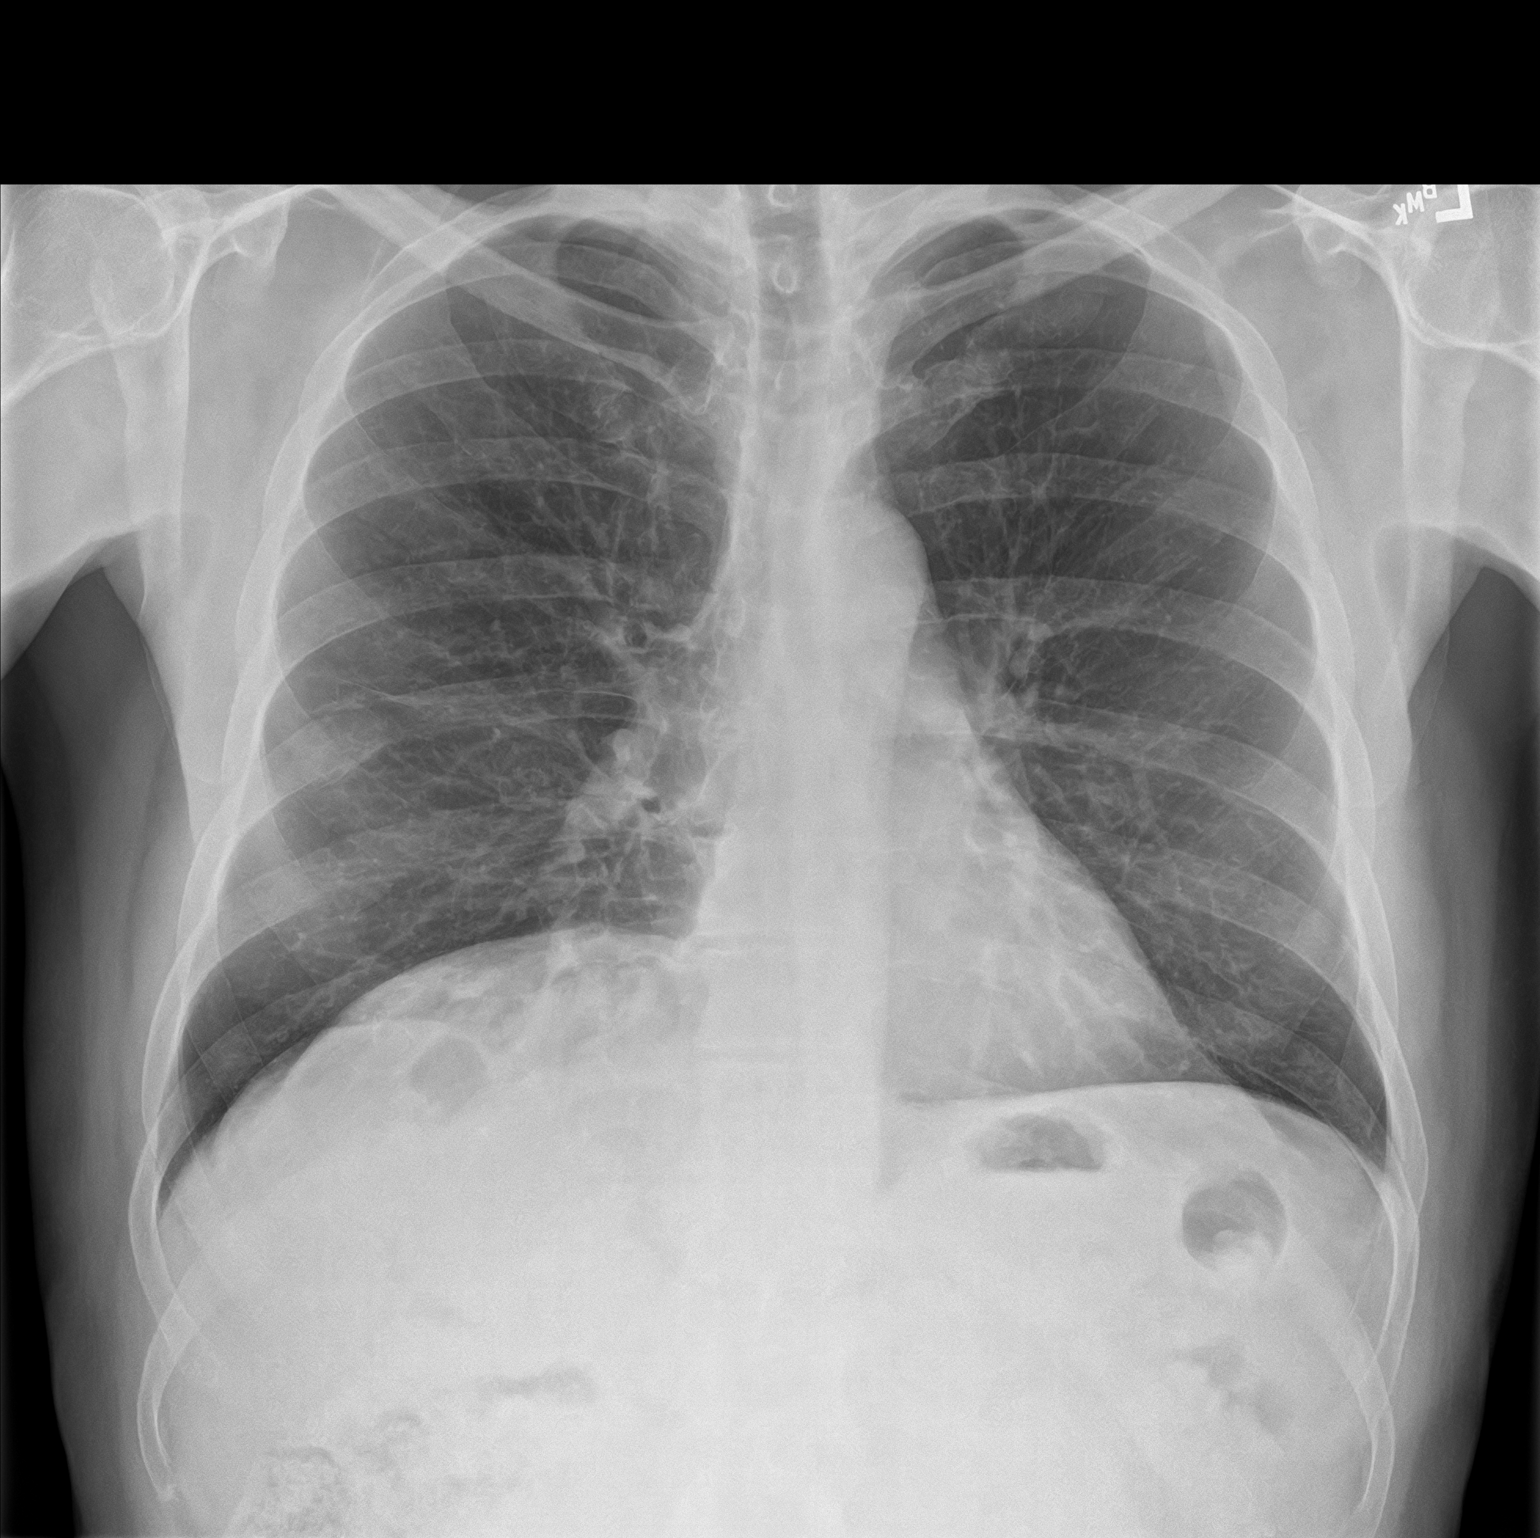
[im 2/2]
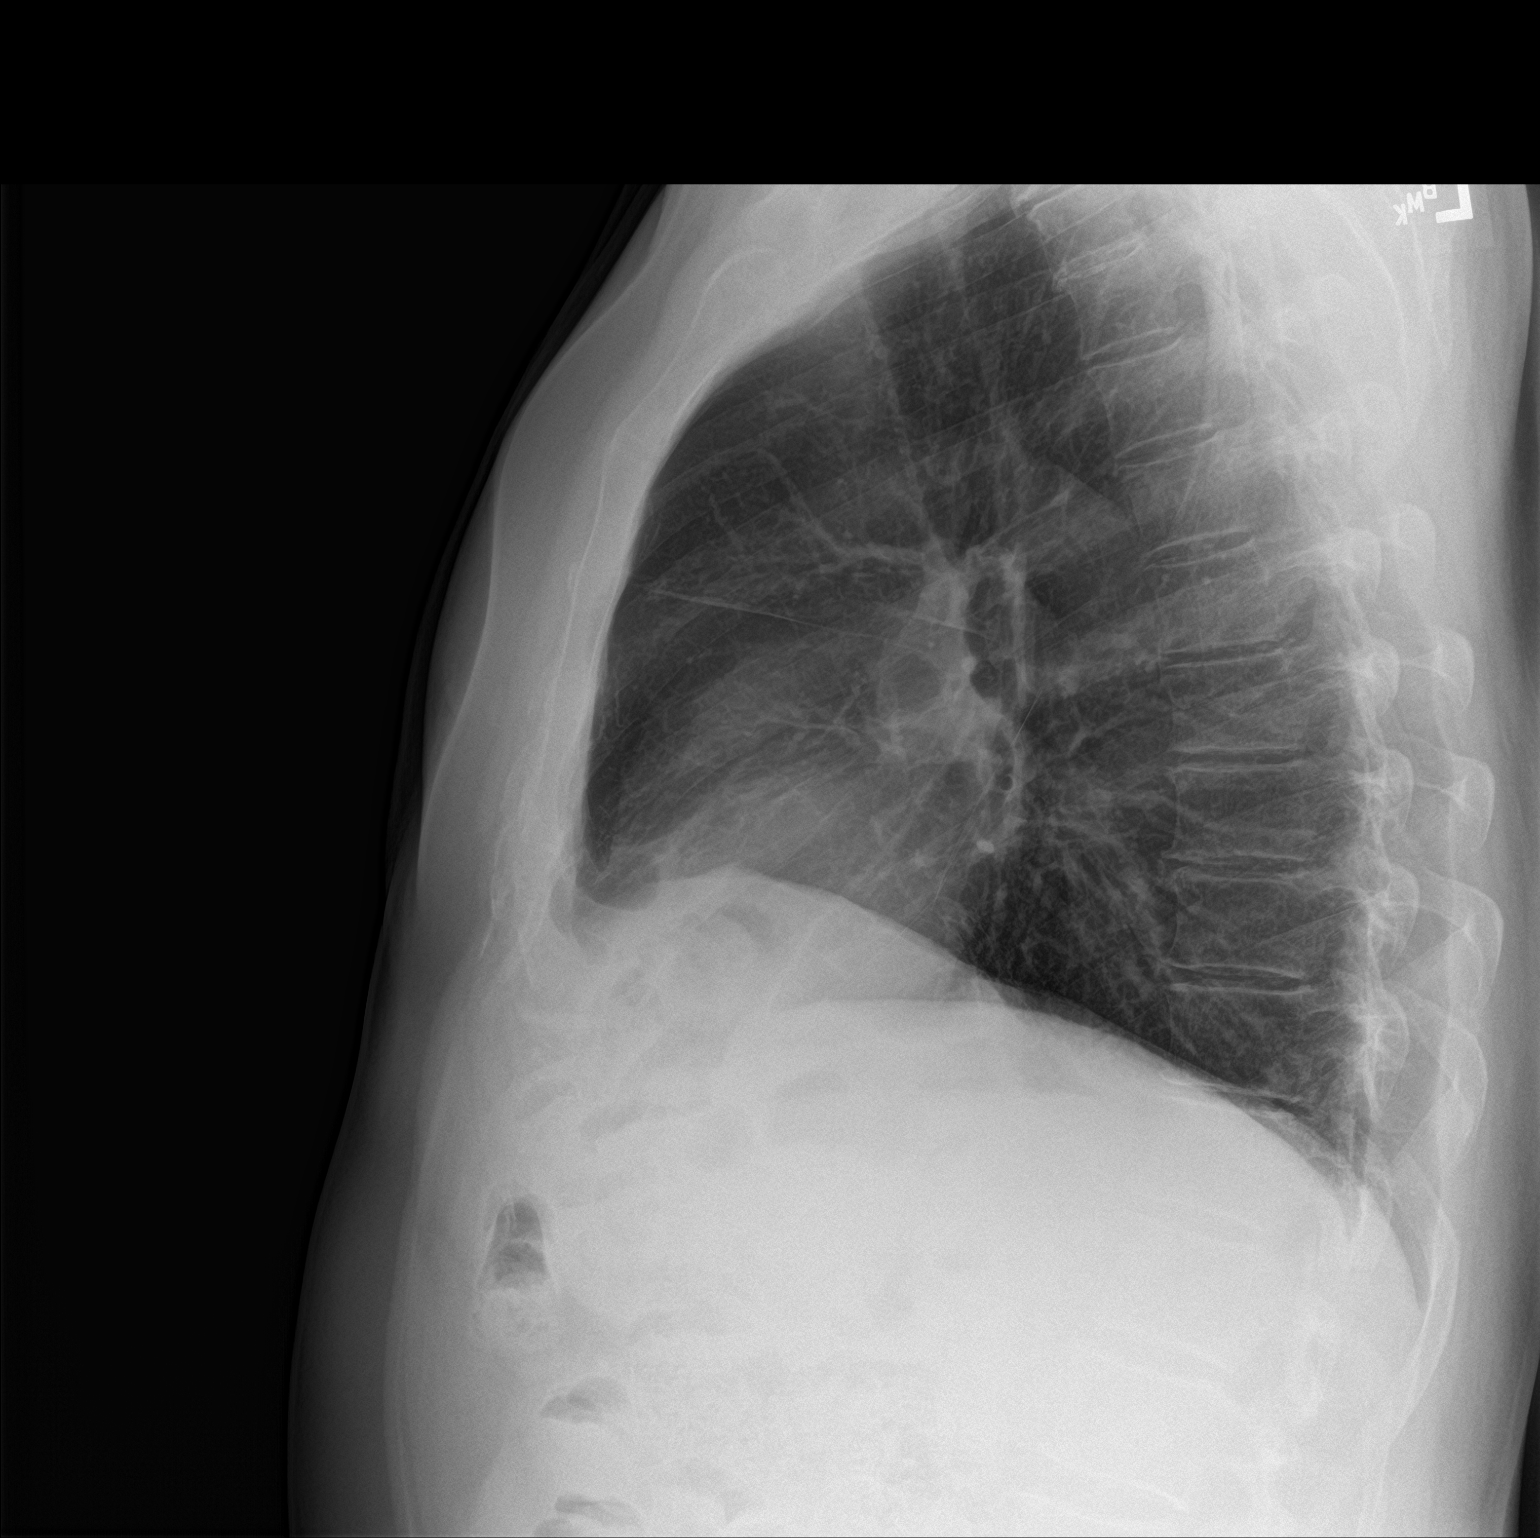

[2 of 2 positions shown; findings below may reference images not displayed]

FINDINGS: Heart size and pulmonary vascularity are normal. Linear scarring in
the right mid lung. No airspace disease, consolidation, or edema. No
blunting of costophrenic angles. No pneumothorax. Mediastinal
contours appear intact. Mild degenerative changes in the spine.
IMPRESSION: No active cardiopulmonary disease.

## 2018-11-15 ENCOUNTER — Telehealth: Payer: Self-pay | Admitting: Family Medicine

## 2018-11-15 NOTE — Telephone Encounter (Signed)
Pt is requesting a list of all his surgeries. He stated that it should be in his chart. Pt is requesting that he pick it up

## 2018-11-25 ENCOUNTER — Emergency Department (HOSPITAL_COMMUNITY): Payer: Medicare Other

## 2018-11-25 ENCOUNTER — Emergency Department (HOSPITAL_COMMUNITY)
Admission: EM | Admit: 2018-11-25 | Discharge: 2018-11-25 | Disposition: A | Discharge status: Home / Self Care | Payer: Medicare Other | Attending: Emergency Medicine | Admitting: Emergency Medicine

## 2018-11-25 ENCOUNTER — Encounter (HOSPITAL_COMMUNITY): Payer: Self-pay

## 2018-11-25 ENCOUNTER — Other Ambulatory Visit: Payer: Self-pay

## 2018-11-25 DIAGNOSIS — K5730 Diverticulosis of large intestine without perforation or abscess without bleeding: Secondary | ICD-10-CM | POA: Diagnosis not present

## 2018-11-25 DIAGNOSIS — R1013 Epigastric pain: Secondary | ICD-10-CM | POA: Insufficient documentation

## 2018-11-25 DIAGNOSIS — I10 Essential (primary) hypertension: Secondary | ICD-10-CM | POA: Insufficient documentation

## 2018-11-25 DIAGNOSIS — Z7982 Long term (current) use of aspirin: Secondary | ICD-10-CM | POA: Diagnosis not present

## 2018-11-25 DIAGNOSIS — Z79899 Other long term (current) drug therapy: Secondary | ICD-10-CM | POA: Diagnosis not present

## 2018-11-25 DIAGNOSIS — Z85828 Personal history of other malignant neoplasm of skin: Secondary | ICD-10-CM | POA: Insufficient documentation

## 2018-11-25 LAB — COMPREHENSIVE METABOLIC PANEL
ALT: 19 U/L (ref 0–44)
AST: 20 U/L (ref 15–41)
Albumin: 3.9 g/dL (ref 3.5–5.0)
Alkaline Phosphatase: 45 U/L (ref 38–126)
Anion gap: 7 (ref 5–15)
BUN: 18 mg/dL (ref 8–23)
CO2: 26 mmol/L (ref 22–32)
Calcium: 8.3 mg/dL — ABNORMAL LOW (ref 8.9–10.3)
Chloride: 107 mmol/L (ref 98–111)
Creatinine, Ser: 0.99 mg/dL (ref 0.61–1.24)
GFR calc Af Amer: >60 mL/min (ref >60)
GFR calc non Af Amer: >60 mL/min (ref >60)
Glucose, Bld: 99 mg/dL (ref 70–99)
Potassium: 3.8 mmol/L (ref 3.5–5.1)
Sodium: 140 mmol/L (ref 135–145)
Total Bilirubin: 0.5 mg/dL (ref 0.3–1.2)
Total Protein: 6.2 g/dL — ABNORMAL LOW (ref 6.5–8.1)

## 2018-11-25 LAB — CBC
HCT: 40 % (ref 39.0–52.0)
Hemoglobin: 12.8 g/dL — ABNORMAL LOW (ref 13.0–17.0)
MCH: 29.8 pg (ref 26.0–34.0)
MCHC: 32 g/dL (ref 30.0–36.0)
MCV: 93.2 fL (ref 80.0–100.0)
Platelets: 156 10*3/uL (ref 150–400)
RBC: 4.29 MIL/uL (ref 4.22–5.81)
RDW: 14.6 % (ref 11.5–15.5)
WBC: 6 10*3/uL (ref 4.0–10.5)
nRBC: 0 % (ref 0.0–0.2)

## 2018-11-25 LAB — LIPASE, BLOOD: Lipase: 38 U/L (ref 11–51)

## 2018-11-25 MED ORDER — IOHEXOL 300 MG/ML  SOLN
100.0000 mL | Freq: Once | INTRAMUSCULAR | Status: AC | PRN
  Administered 2018-11-25: 23:00:00 100 mL via INTRAVENOUS

## 2018-11-25 MED ORDER — SODIUM CHLORIDE (PF) 0.9 % IJ SOLN
INTRAMUSCULAR | Status: AC
  Filled 2018-11-25: qty 50

## 2018-11-25 MED ORDER — SODIUM CHLORIDE 0.9% FLUSH: 3.0000 mL | Freq: Once | INTRAVENOUS | Status: DC
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
## 2018-11-25 NOTE — ED Triage Notes (Signed)
Pt c/o epigastric pain since last night. Pt states that he has a hernia with a "screen" in it and is concerned that may be the problem. No issues urinating.

## 2018-11-25 NOTE — Discharge Instructions (Addendum)
Call the urologist for a follow-up appointment.  The CAT scan showed some enlargement of your prostate and urinary bladder.  The CT scan did not show any significant abnormalities or complications from your prior surgery.  You can take Tylenol for pain.

## 2018-11-25 NOTE — ED Notes (Addendum)
Pt states he has signed up as a body donor for the PA program at [facility] if anything happens to him. Please contact [place] [phone]  . Pt has requested this information to be put in "his record".

## 2018-11-29 ENCOUNTER — Telehealth: Payer: Self-pay | Admitting: Family Medicine

## 2018-11-29 NOTE — Telephone Encounter (Signed)
@  [facility] Chronic Care Management   Outreach Note  11/29/2018 Name: Jeffery Vasquez MRN: [id number] DOB: 1946/03/10  Referred by: Arnetha Courser, MD Reason for referral : Chronic Care Management (Initial CCM outreach call was unscuccessful.)   An unsuccessful telephone outreach was attempted today. The patient was referred to the case management team by for assistance with chronic care management and care coordination.   Follow Up Plan: A HIPPA compliant phone message was left for the patient providing contact information and requesting a return call.  The CM team will reach out to the patient again over the next 7 days.  If patient returns call to provider office, please advise to call [facility] [place]  [email or web address]   ??[phone]

## 2018-12-04 ENCOUNTER — Ambulatory Visit: Payer: Self-pay | Admitting: Family Medicine

## 2018-12-04 NOTE — Chronic Care Management (AMB) (Signed)
Chronic Care Management   Note  12/04/2018 Name: Jeffery Vasquez MRN: [id number] DOB: Nov 25, 1945  Jeffery Vasquez is a 73 y.o. year old male who is a primary care patient of Lada, Satira Anis, MD. I reached out to [name] by phone today in response to a referral sent by [name] [facility] health plan.    [name] given information about Chronic Care Management services today including:  1. CCM service includes personalized support from designated clinical staff supervised by his physician, including individualized plan of care and coordination with other care providers 2. 24/7 contact phone numbers for assistance for urgent and routine care needs. 3. Service will only be billed when office clinical staff spend 20 minutes or more in a month to coordinate care. 4. Only one practitioner may furnish and bill the service in a calendar month. 5. The patient may stop CCM services at any time (effective at the end of the month) by phone call to the office staff. 6. The patient will be responsible for cost sharing (co-pay) of up to 20% of the service fee (after annual deductible is met).  Patient did not agree to enrollment in care management services and does not wish to consider at this time.  Follow up plan: The patient has been provided with contact information for the chronic care management team and has been advised to call with any health related questions or concerns.   [place]  [email or web address]   ??[phone]

## 2019-01-29 DIAGNOSIS — R3912 Poor urinary stream: Secondary | ICD-10-CM | POA: Diagnosis not present

## 2019-01-29 DIAGNOSIS — R972 Elevated prostate specific antigen [PSA]: Secondary | ICD-10-CM | POA: Diagnosis not present

## 2019-01-29 DIAGNOSIS — N401 Enlarged prostate with lower urinary tract symptoms: Secondary | ICD-10-CM | POA: Diagnosis not present

## 2019-03-06 DIAGNOSIS — R972 Elevated prostate specific antigen [PSA]: Secondary | ICD-10-CM | POA: Diagnosis not present

## 2019-03-06 DIAGNOSIS — R310 Gross hematuria: Secondary | ICD-10-CM | POA: Diagnosis not present

## 2019-04-05 DIAGNOSIS — I8390 Asymptomatic varicose veins of unspecified lower extremity: Secondary | ICD-10-CM | POA: Insufficient documentation

## 2019-04-05 DIAGNOSIS — N138 Other obstructive and reflux uropathy: Secondary | ICD-10-CM | POA: Diagnosis not present

## 2019-04-05 DIAGNOSIS — H02839 Dermatochalasis of unspecified eye, unspecified eyelid: Secondary | ICD-10-CM | POA: Insufficient documentation

## 2019-04-05 DIAGNOSIS — I10 Essential (primary) hypertension: Secondary | ICD-10-CM | POA: Diagnosis not present

## 2019-04-05 DIAGNOSIS — Z87442 Personal history of urinary calculi: Secondary | ICD-10-CM | POA: Insufficient documentation

## 2019-04-05 DIAGNOSIS — J309 Allergic rhinitis, unspecified: Secondary | ICD-10-CM | POA: Diagnosis not present

## 2019-04-05 DIAGNOSIS — Z86018 Personal history of other benign neoplasm: Secondary | ICD-10-CM | POA: Diagnosis not present

## 2019-04-05 DIAGNOSIS — N401 Enlarged prostate with lower urinary tract symptoms: Secondary | ICD-10-CM | POA: Diagnosis not present

## 2019-04-05 DIAGNOSIS — H532 Diplopia: Secondary | ICD-10-CM | POA: Insufficient documentation

## 2019-04-05 DIAGNOSIS — Z9889 Other specified postprocedural states: Secondary | ICD-10-CM | POA: Diagnosis not present

## 2019-04-05 DIAGNOSIS — D126 Benign neoplasm of colon, unspecified: Secondary | ICD-10-CM | POA: Insufficient documentation

## 2019-04-05 DIAGNOSIS — Z2821 Immunization not carried out because of patient refusal: Secondary | ICD-10-CM | POA: Diagnosis not present

## 2019-04-05 DIAGNOSIS — H903 Sensorineural hearing loss, bilateral: Secondary | ICD-10-CM | POA: Diagnosis not present

## 2019-04-05 DIAGNOSIS — K5790 Diverticulosis of intestine, part unspecified, without perforation or abscess without bleeding: Secondary | ICD-10-CM | POA: Insufficient documentation

## 2019-06-13 DIAGNOSIS — Z0000 Encounter for general adult medical examination without abnormal findings: Secondary | ICD-10-CM | POA: Diagnosis not present

## 2019-06-13 DIAGNOSIS — Z1159 Encounter for screening for other viral diseases: Secondary | ICD-10-CM | POA: Diagnosis not present

## 2019-06-13 DIAGNOSIS — R7301 Impaired fasting glucose: Secondary | ICD-10-CM | POA: Diagnosis not present

## 2020-03-10 ENCOUNTER — Other Ambulatory Visit: Payer: Self-pay

## 2020-03-10 ENCOUNTER — Ambulatory Visit
Admission: EM | Admit: 2020-03-10 | Discharge: 2020-03-10 | Disposition: A | Discharge status: Home / Self Care | Payer: Self-pay | Attending: Family Medicine | Admitting: Family Medicine

## 2020-03-10 ENCOUNTER — Ambulatory Visit (INDEPENDENT_AMBULATORY_CARE_PROVIDER_SITE_OTHER): Payer: Self-pay

## 2020-03-10 DIAGNOSIS — M79641 Pain in right hand: Secondary | ICD-10-CM

## 2020-03-10 DIAGNOSIS — M7918 Myalgia, other site: Secondary | ICD-10-CM

## 2020-03-10 DIAGNOSIS — M542 Cervicalgia: Secondary | ICD-10-CM

## 2020-03-10 DIAGNOSIS — M25531 Pain in right wrist: Secondary | ICD-10-CM

## 2020-03-10 MED ORDER — TRAMADOL HCL 50 MG PO TABS: 50.0000 mg | ORAL_TABLET | Freq: Three times a day (TID) | ORAL | 0 refills | Status: DC | PRN

## 2020-03-10 NOTE — Discharge Instructions (Signed)
Medication as directed. ° °Take care ° °Dr. Adysen Raphael  °
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
## 2020-08-04 DIAGNOSIS — H2513 Age-related nuclear cataract, bilateral: Secondary | ICD-10-CM | POA: Diagnosis not present

## 2020-08-11 ENCOUNTER — Inpatient Hospital Stay: Admit: 2020-08-11 | Discharge: 2020-08-11 | Disposition: A | Discharge status: Home / Self Care | Payer: Medicare Other

## 2020-08-11 ENCOUNTER — Other Ambulatory Visit: Payer: Self-pay | Admitting: *Deleted

## 2020-08-11 ENCOUNTER — Ambulatory Visit (INDEPENDENT_AMBULATORY_CARE_PROVIDER_SITE_OTHER): Payer: Medicare Other | Admitting: Urology

## 2020-08-11 ENCOUNTER — Other Ambulatory Visit: Payer: Self-pay

## 2020-08-11 ENCOUNTER — Encounter: Payer: Self-pay | Admitting: Urology

## 2020-08-11 VITALS — BP 157/97 | HR 71 | Ht 67.0 in | Wt 180.0 lb

## 2020-08-11 DIAGNOSIS — R310 Gross hematuria: Secondary | ICD-10-CM

## 2020-08-11 DIAGNOSIS — N529 Male erectile dysfunction, unspecified: Secondary | ICD-10-CM

## 2020-08-11 DIAGNOSIS — N401 Enlarged prostate with lower urinary tract symptoms: Secondary | ICD-10-CM | POA: Diagnosis not present

## 2020-08-11 DIAGNOSIS — Z125 Encounter for screening for malignant neoplasm of prostate: Secondary | ICD-10-CM

## 2020-08-11 DIAGNOSIS — N138 Other obstructive and reflux uropathy: Secondary | ICD-10-CM

## 2020-08-11 LAB — URINALYSIS, COMPLETE (UACMP) WITH MICROSCOPIC
Bilirubin Urine: NEGATIVE
Glucose, UA: NEGATIVE mg/dL
Hgb urine dipstick: NEGATIVE
Ketones, ur: NEGATIVE mg/dL
Leukocytes,Ua: NEGATIVE
Nitrite: NEGATIVE
RBC / HPF: NONE SEEN RBC/hpf (ref 0–5)
Specific Gravity, Urine: >1.03 — ABNORMAL HIGH (ref 1.005–1.030)
pH: 5.5 (ref 5.0–8.0)

## 2020-08-11 LAB — BLADDER SCAN AMB NON-IMAGING: Scan Result: 47
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
## 2020-08-11 NOTE — Patient Instructions (Addendum)
Prostate Cancer Screening  Prostate cancer screening is a test that is done to check for the presence of prostate cancer in men. The prostate gland is a walnut-sized gland that is located below the bladder and in front of the rectum in males. The function of the prostate is to add fluid to semen during ejaculation. Prostate cancer is the second most common type of cancer in men. Who should have prostate cancer screening?  Screening recommendations vary based on age and other risk factors. Screening is recommended if:  You are older than age 55. If you are age 55-69, talk with your health care provider about your need for screening and how often screening should be done. Because most prostate cancers are slow growing and will not cause death, screening is generally reserved in this age group for men who have a 10-15-year life expectancy.  You are younger than age 55, and you have these risk factors: ? Being a black male or a male of African descent. ? Having a father, brother, or uncle who has been diagnosed with prostate cancer. The risk is higher if your family member's cancer occurred at an early age. Screening is not recommended if:  You are younger than age 40.  You are between the ages of 40 and 54 and you have no risk factors.  You are 70 years of age or older. At this age, the risks that screening can cause are greater than the benefits that it may provide. If you are at high risk for prostate cancer, your health care provider may recommend that you have screenings more often or that you start screening at a younger age. How is screening for prostate cancer done? The recommended prostate cancer screening test is a blood test called the prostate-specific antigen (PSA) test. PSA is a protein that is made in the prostate. As you age, your prostate naturally produces more PSA. Abnormally high PSA levels may be caused by:  Prostate cancer.  An enlarged prostate that is not caused by cancer  (benign prostatic hyperplasia, BPH). This condition is very common in older men.  A prostate gland infection (prostatitis). Depending on the PSA results, you may need more tests, such as:  A physical exam to check the size of your prostate gland.  Blood and imaging tests.  A procedure to remove tissue samples from your prostate gland for testing (biopsy). What are the benefits of prostate cancer screening?  Screening can help to identify cancer at an early stage, before symptoms start and when the cancer can be treated more easily.  There is a small chance that screening may lower your risk of dying from prostate cancer. The chance is small because prostate cancer is a slow-growing cancer, and most men with prostate cancer die from a different cause. What are the risks of prostate cancer screening? The main risk of prostate cancer screening is diagnosing and treating prostate cancer that would never have caused any symptoms or problems. This is called overdiagnosisand overtreatment. PSA screening cannot tell you if your PSA is high due to cancer or a different cause. A prostate biopsy is the only procedure to diagnose prostate cancer. Even the results of a biopsy may not tell you if your cancer needs to be treated. Slow-growing prostate cancer may not need any treatment other than monitoring, so diagnosing and treating it may cause unnecessary stress or other side effects. A prostate biopsy may also cause:  Infection or fever.  A false negative. This is   a result that shows that you do not have prostate cancer when you actually do have prostate cancer. Questions to ask your health care provider  When should I start prostate cancer screening?  What is my risk for prostate cancer?  How often do I need screening?  What type of screening tests do I need?  How do I get my test results?  What do my results mean?  Do I need treatment? Where to find more information  The American Cancer  Society: www.cancer.org  American Urological Association: www.auanet.org Contact a health care provider if:  You have difficulty urinating.  You have pain when you urinate or ejaculate.  You have blood in your urine or semen.  You have pain in your back or in the area of your prostate. Summary  Prostate cancer is a common type of cancer in men. The prostate gland is located below the bladder and in front of the rectum. This gland adds fluid to semen during ejaculation.  Prostate cancer screening may identify cancer at an early stage, when the cancer can be treated more easily.  The prostate-specific antigen (PSA) test is the recommended screening test for prostate cancer.  Discuss the risks and benefits of prostate cancer screening with your health care provider. If you are age 74 or older, the risks that screening can cause are greater than the benefits that it may provide. This information is not intended to replace advice given to you by your health care provider. Make sure you discuss any questions you have with your health care provider. Document Revised: 10/11/2019 Document Reviewed: 01/31/2019 Elsevier Patient Education  [place].   Benign Prostatic Hyperplasia  Benign prostatic hyperplasia (BPH) is an enlarged prostate gland that is caused by the normal aging process and not by cancer. The prostate is a walnut-sized gland that is involved in the production of semen. It is located in front of the rectum and below the bladder. The bladder stores urine and the urethra is the tube that carries the urine out of the body. The prostate may get bigger as a man gets older. An enlarged prostate can press on the urethra. This can make it harder to pass urine. The build-up of urine in the bladder can cause infection. Back pressure and infection may progress to bladder damage and kidney (renal) failure. What are the causes? This condition is part of a normal aging process.  However, not all men develop problems from this condition. If the prostate enlarges away from the urethra, urine flow will not be blocked. If it enlarges toward the urethra and compresses it, there will be problems passing urine. What increases the risk? This condition is more likely to develop in men over the age of 27 years. What are the signs or symptoms? Symptoms of this condition include:  Getting up often during the night to urinate.  Needing to urinate frequently during the day.  Difficulty starting urine flow.  Decrease in size and strength of your urine stream.  Leaking (dribbling) after urinating.  Inability to pass urine. This needs immediate treatment.  Inability to completely empty your bladder.  Pain when you pass urine. This is more common if there is also an infection.  Urinary tract infection (UTI). How is this diagnosed? This condition is diagnosed based on your medical history, a physical exam, and your symptoms. Tests will also be done, such as:  A post-void bladder scan. This measures any amount of urine that may remain in your  bladder after you finish urinating.  A digital rectal exam. In a rectal exam, your health care provider checks your prostate by putting a lubricated, gloved finger into your rectum to feel the back of your prostate gland. This exam detects the size of your gland and any abnormal lumps or growths.  An exam of your urine (urinalysis).  A prostate specific antigen (PSA) screening. This is a blood test used to screen for prostate cancer.  An ultrasound. This test uses sound waves to electronically produce a picture of your prostate gland. Your health care provider may refer you to a specialist in kidney and prostate diseases (urologist). How is this treated? Once symptoms begin, your health care provider will monitor your condition (active surveillance or watchful waiting). Treatment for this condition will depend on the severity of your  condition. Treatment may include:  Observation and yearly exams. This may be the only treatment needed if your condition and symptoms are mild.  Medicines to relieve your symptoms, including: ? Medicines to shrink the prostate. ? Medicines to relax the muscle of the prostate.  Surgery in severe cases. Surgery may include: ? Prostatectomy. In this procedure, the prostate tissue is removed completely through an open incision or with a laparoscope or robotics. ? Transurethral resection of the prostate (TURP). In this procedure, a tool is inserted through the opening at the tip of the penis (urethra). It is used to cut away tissue of the inner core of the prostate. The pieces are removed through the same opening of the penis. This removes the blockage. ? Transurethral incision (TUIP). In this procedure, small cuts are made in the prostate. This lessens the prostate's pressure on the urethra. ? Transurethral microwave thermotherapy (TUMT). This procedure uses microwaves to create heat. The heat destroys and removes a small amount of prostate tissue. ? Transurethral needle ablation (TUNA). This procedure uses radio frequencies to destroy and remove a small amount of prostate tissue. ? Interstitial laser coagulation ([place]). This procedure uses a laser to destroy and remove a small amount of prostate tissue. ? Transurethral electrovaporization (TUVP). This procedure uses electrodes to destroy and remove a small amount of prostate tissue. ? Prostatic urethral lift. This procedure inserts an implant to push the lobes of the prostate away from the urethra. Follow these instructions at home:  Take over-the-counter and prescription medicines only as told by your health care provider.  Monitor your symptoms for any changes. Contact your health care provider with any changes.  Avoid drinking large amounts of liquid before going to bed or out in public.  Avoid or reduce how much caffeine or alcohol you  drink.  Give yourself time when you urinate.  Keep all follow-up visits as told by your health care provider. This is important. Contact a health care provider if:  You have unexplained back pain.  Your symptoms do not get better with treatment.  You develop side effects from the medicine you are taking.  Your urine becomes very dark or has a bad smell.  Your lower abdomen becomes distended and you have trouble passing your urine. Get help right away if:  You have a fever or chills.  You suddenly cannot urinate.  You feel lightheaded, or very dizzy, or you faint.  There are large amounts of blood or clots in the urine.  Your urinary problems become hard to manage.  You develop moderate to severe low back or flank pain. The flank is the side of your body between the ribs  and the hip. These symptoms may represent a serious problem that is an emergency. Do not wait to see if the symptoms will go away. Get medical help right away. Call your local emergency services (911 in the [place] Do not drive yourself to the hospital. Summary  Benign prostatic hyperplasia (BPH) is an enlarged prostate that is caused by the normal aging process and not by cancer.  An enlarged prostate can press on the urethra. This can make it hard to pass urine.  This condition is part of a normal aging process and is more likely to develop in men over the age of 44 years.  Get help right away if you suddenly cannot urinate. This information is not intended to replace advice given to you by your health care provider. Make sure you discuss any questions you have with your health care provider. Document Revised: 02/27/2020 Document Reviewed: 02/27/2020 Elsevier Patient Education  [place].

## 2020-09-11 ENCOUNTER — Other Ambulatory Visit: Payer: Self-pay

## 2020-09-11 ENCOUNTER — Telehealth: Payer: Self-pay

## 2020-09-11 DIAGNOSIS — M1990 Unspecified osteoarthritis, unspecified site: Secondary | ICD-10-CM | POA: Insufficient documentation

## 2020-09-11 DIAGNOSIS — L219 Seborrheic dermatitis, unspecified: Secondary | ICD-10-CM | POA: Insufficient documentation

## 2020-09-11 DIAGNOSIS — H9190 Unspecified hearing loss, unspecified ear: Secondary | ICD-10-CM | POA: Insufficient documentation

## 2020-09-11 NOTE — Telephone Encounter (Signed)
LVM for pt to return my call to schedule a colonoscopy and EGD with Dr. Allen Norris.

## 2020-09-18 ENCOUNTER — Other Ambulatory Visit: Payer: Self-pay

## 2020-09-18 DIAGNOSIS — J301 Allergic rhinitis due to pollen: Secondary | ICD-10-CM | POA: Diagnosis not present

## 2020-09-18 DIAGNOSIS — H90A21 Sensorineural hearing loss, unilateral, right ear, with restricted hearing on the contralateral side: Secondary | ICD-10-CM | POA: Diagnosis not present

## 2020-09-28 ENCOUNTER — Telehealth: Payer: Self-pay

## 2020-09-28 NOTE — Telephone Encounter (Signed)
Left vm to return pt's call regarding questions about his covid testing and bowel prep.

## 2020-09-29 ENCOUNTER — Other Ambulatory Visit: Payer: Self-pay

## 2020-09-29 DIAGNOSIS — K2100 Gastro-esophageal reflux disease with esophagitis, without bleeding: Secondary | ICD-10-CM

## 2020-09-29 DIAGNOSIS — Z86010 Personal history of colonic polyps: Secondary | ICD-10-CM

## 2020-09-29 MED ORDER — SUPREP BOWEL PREP KIT 17.5-3.13-1.6 GM/177ML PO SOLN: 1.0000 | ORAL | 0 refills | Status: DC

## 2020-09-30 ENCOUNTER — Other Ambulatory Visit: Admission: RE | Admit: 2020-09-30 | Payer: Medicare Other | Source: Ambulatory Visit

## 2020-09-30 ENCOUNTER — Other Ambulatory Visit
Admission: RE | Admit: 2020-09-30 | Discharge: 2020-09-30 | Disposition: A | Discharge status: Home / Self Care | Payer: Medicare Other | Source: Ambulatory Visit | Attending: Gastroenterology | Admitting: Gastroenterology

## 2020-09-30 ENCOUNTER — Other Ambulatory Visit: Payer: Self-pay

## 2020-09-30 DIAGNOSIS — Z01812 Encounter for preprocedural laboratory examination: Secondary | ICD-10-CM | POA: Diagnosis not present

## 2020-09-30 DIAGNOSIS — Z20822 Contact with and (suspected) exposure to covid-19: Secondary | ICD-10-CM | POA: Diagnosis not present

## 2020-09-30 LAB — SARS CORONAVIRUS 2 (TAT 6-24 HRS): SARS Coronavirus 2: NEGATIVE

## 2020-10-01 ENCOUNTER — Encounter: Payer: Self-pay | Admitting: Gastroenterology

## 2020-10-01 NOTE — Discharge Instructions (Signed)

## 2020-10-01 NOTE — Telephone Encounter (Signed)
Pt returned my call and all questions answered regarding his covid testing date and his bowel prep instructions.

## 2020-10-02 ENCOUNTER — Ambulatory Visit: Payer: Medicare Other | Admitting: Anesthesiology

## 2020-10-02 ENCOUNTER — Encounter
Admission: RE | Disposition: A | Discharge status: Home / Self Care | Payer: Self-pay | Source: Home / Self Care | Attending: Gastroenterology

## 2020-10-02 ENCOUNTER — Encounter: Payer: Self-pay | Admitting: Gastroenterology

## 2020-10-02 ENCOUNTER — Ambulatory Visit
Admission: RE | Admit: 2020-10-02 | Discharge: 2020-10-02 | Disposition: A | Discharge status: Home / Self Care | Payer: Medicare Other | Attending: Gastroenterology | Admitting: Gastroenterology

## 2020-10-02 DIAGNOSIS — Z881 Allergy status to other antibiotic agents status: Secondary | ICD-10-CM | POA: Diagnosis not present

## 2020-10-02 DIAGNOSIS — Z833 Family history of diabetes mellitus: Secondary | ICD-10-CM | POA: Insufficient documentation

## 2020-10-02 DIAGNOSIS — Z79899 Other long term (current) drug therapy: Secondary | ICD-10-CM | POA: Insufficient documentation

## 2020-10-02 DIAGNOSIS — Z808 Family history of malignant neoplasm of other organs or systems: Secondary | ICD-10-CM | POA: Insufficient documentation

## 2020-10-02 DIAGNOSIS — Z888 Allergy status to other drugs, medicaments and biological substances status: Secondary | ICD-10-CM | POA: Insufficient documentation

## 2020-10-02 DIAGNOSIS — K2100 Gastro-esophageal reflux disease with esophagitis, without bleeding: Secondary | ICD-10-CM

## 2020-10-02 DIAGNOSIS — K635 Polyp of colon: Secondary | ICD-10-CM | POA: Diagnosis not present

## 2020-10-02 DIAGNOSIS — Z8261 Family history of arthritis: Secondary | ICD-10-CM | POA: Diagnosis not present

## 2020-10-02 DIAGNOSIS — K5730 Diverticulosis of large intestine without perforation or abscess without bleeding: Secondary | ICD-10-CM | POA: Insufficient documentation

## 2020-10-02 DIAGNOSIS — K641 Second degree hemorrhoids: Secondary | ICD-10-CM | POA: Insufficient documentation

## 2020-10-02 DIAGNOSIS — D124 Benign neoplasm of descending colon: Secondary | ICD-10-CM | POA: Diagnosis not present

## 2020-10-02 DIAGNOSIS — Z86010 Personal history of colonic polyps: Secondary | ICD-10-CM

## 2020-10-02 DIAGNOSIS — D123 Benign neoplasm of transverse colon: Secondary | ICD-10-CM | POA: Insufficient documentation

## 2020-10-02 DIAGNOSIS — Z8349 Family history of other endocrine, nutritional and metabolic diseases: Secondary | ICD-10-CM | POA: Diagnosis not present

## 2020-10-02 DIAGNOSIS — Z1211 Encounter for screening for malignant neoplasm of colon: Secondary | ICD-10-CM | POA: Insufficient documentation

## 2020-10-02 DIAGNOSIS — Z8249 Family history of ischemic heart disease and other diseases of the circulatory system: Secondary | ICD-10-CM | POA: Diagnosis not present

## 2020-10-02 DIAGNOSIS — Z823 Family history of stroke: Secondary | ICD-10-CM | POA: Insufficient documentation

## 2020-10-02 HISTORY — PX: COLONOSCOPY WITH PROPOFOL: SHX5780

## 2020-10-02 HISTORY — PX: POLYPECTOMY: SHX5525

## 2020-10-02 HISTORY — PX: ESOPHAGOGASTRODUODENOSCOPY (EGD) WITH PROPOFOL: SHX5813

## 2020-10-02 SURGERY — COLONOSCOPY WITH PROPOFOL
Anesthesia: General

## 2020-10-02 MED ORDER — PROMETHAZINE HCL 25 MG/ML IJ SOLN: 6.2500 mg | INTRAMUSCULAR | Status: DC | PRN

## 2020-10-02 MED ORDER — STERILE WATER FOR IRRIGATION IR SOLN
Status: DC | PRN
  Administered 2020-10-02: 150 mL

## 2020-10-02 MED ORDER — FENTANYL CITRATE (PF) 100 MCG/2ML IJ SOLN: 25.0000 ug | INTRAMUSCULAR | Status: DC | PRN

## 2020-10-02 MED ORDER — LACTATED RINGERS IV SOLN: INTRAVENOUS | Status: DC

## 2020-10-02 MED ORDER — GLYCOPYRROLATE 0.2 MG/ML IJ SOLN
INTRAMUSCULAR | Status: DC | PRN
  Administered 2020-10-02: .2 mg via INTRAVENOUS

## 2020-10-02 MED ORDER — MEPERIDINE HCL 25 MG/ML IJ SOLN: 6.2500 mg | INTRAMUSCULAR | Status: DC | PRN

## 2020-10-02 MED ORDER — OXYCODONE HCL 5 MG PO TABS: 5.0000 mg | ORAL_TABLET | Freq: Once | ORAL | Status: DC | PRN

## 2020-10-02 MED ORDER — LIDOCAINE HCL (CARDIAC) PF 100 MG/5ML IV SOSY
PREFILLED_SYRINGE | INTRAVENOUS | Status: DC | PRN
  Administered 2020-10-02: 50 mg via INTRAVENOUS

## 2020-10-02 MED ORDER — SODIUM CHLORIDE 0.9 % IV SOLN: INTRAVENOUS | Status: DC

## 2020-10-02 MED ORDER — OXYCODONE HCL 5 MG/5ML PO SOLN: 5.0000 mg | Freq: Once | ORAL | Status: DC | PRN

## 2020-10-02 MED ORDER — PANTOPRAZOLE SODIUM 40 MG PO TBEC: 40.0000 mg | DELAYED_RELEASE_TABLET | Freq: Every day | ORAL | 11 refills | Status: DC

## 2020-10-02 MED ORDER — PROPOFOL 10 MG/ML IV BOLUS
INTRAVENOUS | Status: DC | PRN
  Administered 2020-10-02: 50 mg via INTRAVENOUS
  Administered 2020-10-02: 20 mg via INTRAVENOUS
  Administered 2020-10-02: 100 mg via INTRAVENOUS

## 2020-10-02 SURGICAL SUPPLY — 38 items
BALLN DILATOR 10-12 8 (BALLOONS)
BALLN DILATOR 12-15 8 (BALLOONS)
BALLN DILATOR 15-18 8 (BALLOONS)
BALLN DILATOR CRE 0-12 8 (BALLOONS)
BALLN DILATOR ESOPH 8 10 CRE (MISCELLANEOUS) IMPLANT
BALLOON DILATOR 12-15 8 (BALLOONS) IMPLANT
BALLOON DILATOR 15-18 8 (BALLOONS) IMPLANT
BALLOON DILATOR CRE 0-12 8 (BALLOONS) IMPLANT
BLOCK BITE 60FR ADLT L/F GRN (MISCELLANEOUS) ×3 IMPLANT
CLIP HMST 235XBRD CATH ROT (MISCELLANEOUS) IMPLANT
CLIP RESOLUTION 360 11X235 (MISCELLANEOUS)
ELECT REM PT RETURN 9FT ADLT (ELECTROSURGICAL)
ELECTRODE REM PT RTRN 9FT ADLT (ELECTROSURGICAL) IMPLANT
FCP ESCP3.2XJMB 240X2.8X (MISCELLANEOUS)
FORCEPS BIOP RAD 4 LRG CAP 4 (CUTTING FORCEPS) ×3 IMPLANT
FORCEPS BIOP RJ4 240 W/NDL (MISCELLANEOUS)
FORCEPS ESCP3.2XJMB 240X2.8X (MISCELLANEOUS) IMPLANT
GOWN CVR UNV OPN BCK APRN NK (MISCELLANEOUS) ×4 IMPLANT
GOWN ISOL THUMB LOOP REG UNIV (MISCELLANEOUS) ×6
INJECTOR VARIJECT VIN23 (MISCELLANEOUS) IMPLANT
KIT DEFENDO VALVE AND CONN (KITS) IMPLANT
KIT PRC NS LF DISP ENDO (KITS) ×2 IMPLANT
KIT PROCEDURE OLYMPUS (KITS) ×3
MANIFOLD NEPTUNE II (INSTRUMENTS) ×3 IMPLANT
MARKER SPOT ENDO TATTOO 5ML (MISCELLANEOUS) IMPLANT
PROBE APC STR FIRE (PROBE) IMPLANT
RETRIEVER NET PLAT FOOD (MISCELLANEOUS) IMPLANT
RETRIEVER NET ROTH 2.5X230 LF (MISCELLANEOUS) IMPLANT
SNARE COLD EXACTO (MISCELLANEOUS) IMPLANT
SNARE SHORT THROW 13M SML OVAL (MISCELLANEOUS) IMPLANT
SNARE SHORT THROW 30M LRG OVAL (MISCELLANEOUS) IMPLANT
SNARE SNG USE RND 15MM (INSTRUMENTS) IMPLANT
SPOT EX ENDOSCOPIC TATTOO (MISCELLANEOUS)
SYR INFLATION 60ML (SYRINGE) IMPLANT
TRAP ETRAP POLY (MISCELLANEOUS) IMPLANT
VARIJECT INJECTOR VIN23 (MISCELLANEOUS)
WATER STERILE IRR 250ML POUR (IV SOLUTION) ×3 IMPLANT
WIRE CRE 18-20MM 8CM F G (MISCELLANEOUS) IMPLANT

## 2020-10-02 NOTE — Transfer of Care (Signed)
Immediate Anesthesia Transfer of Care Note  Patient: Jeffery Vasquez  Procedure(s) Performed: COLONOSCOPY WITH PROPOFOL (N/A ) ESOPHAGOGASTRODUODENOSCOPY (EGD) WITH PROPOFOL (N/A ) POLYPECTOMY  Patient Location: PACU  Anesthesia Type: General  Level of Consciousness: awake, alert  and patient cooperative  Airway and Oxygen Therapy: Patient Spontanous Breathing and Patient connected to supplemental oxygen  Post-op Assessment: Post-op Vital signs reviewed, Patient's Cardiovascular Status Stable, Respiratory Function Stable, Patent Airway and No signs of Nausea or vomiting  Post-op Vital Signs: Reviewed and stable  Complications: No complications documented.

## 2020-10-02 NOTE — Op Note (Signed)
[facility] Patient Name: Jeffery Vasquez Procedure Date: 10/02/2020 11:20 AM MRN: [id number] Account #: [id number] Date of Birth: August 25, 1945 Admit Type: Outpatient Age: 75 Room: [facility] OR ROOM 01 Gender: Male Note Status: Finalized Procedure:             Upper GI endoscopy Indications:           Heartburn Providers:             Lucilla Lame MD, MD Referring MD:          Ane Payment, MD (Referring MD) Medicines:             Propofol per Anesthesia Complications:         No immediate complications. Procedure:             Pre-Anesthesia Assessment:                        - Prior to the procedure, a History and Physical was                         performed, and patient medications and allergies were                         reviewed. The patient's tolerance of previous                         anesthesia was also reviewed. The risks and benefits                         of the procedure and the sedation options and risks                         were discussed with the patient. All questions were                         answered, and informed consent was obtained. Prior                         Anticoagulants: The patient has taken no previous                         anticoagulant or antiplatelet agents. ASA Grade                         Assessment: II - A patient with mild systemic disease.                         After reviewing the risks and benefits, the patient                         was deemed in satisfactory condition to undergo the                         procedure.                        After obtaining informed consent, the endoscope was  passed under direct vision. Throughout the procedure,                         the patient's blood pressure, pulse, and oxygen                         saturations were monitored continuously. The was                         introduced through the mouth, and advanced to the                          second part of duodenum. The upper GI endoscopy was                         accomplished without difficulty. The patient tolerated                         the procedure well. Findings:      LA Grade C (one or more mucosal breaks continuous between tops of 2 or       more mucosal folds, less than 75% circumference) esophagitis with no       bleeding was found in the lower third of the esophagus.      The stomach was normal.      The examined duodenum was normal. Impression:            - LA Grade C reflux esophagitis with no bleeding.                        - Normal stomach.                        - Normal examined duodenum.                        - No specimens collected. Recommendation:        - Discharge patient to home.                        - Resume previous diet.                        - Continue present medications. Procedure Code(s):     --- Professional ---                        [id number], Esophagogastroduodenoscopy, flexible,                         transoral; diagnostic, including collection of                         specimen(s) by brushing or washing, when performed                         (separate procedure) Diagnosis Code(s):     --- Professional ---                        R12, Heartburn  K21.00, Gastro-esophageal reflux disease with                         esophagitis, without bleeding CPT copyright 2019 American Medical Association. All rights reserved. The codes documented in this report are preliminary and upon coder review may  be revised to meet current compliance requirements. Lucilla Lame MD, MD 10/02/2020 11:30:57 AM This report has been signed electronically. Number of Addenda: 0 Note Initiated On: 10/02/2020 11:20 AM Total Procedure Duration: 0 hours 2 minutes 5 seconds  Estimated Blood Loss:  Estimated blood loss: none.      [facility]
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
## 2020-10-02 NOTE — Anesthesia Procedure Notes (Signed)
Procedure Name: MAC Date/Time: 10/02/2020 11:35 AM Performed by: Jeannene Patella, CRNA Pre-anesthesia Checklist: Patient identified, Emergency Drugs available, Suction available, Timeout performed and Patient being monitored Patient Re-evaluated:Patient Re-evaluated prior to induction Oxygen Delivery Method: Nasal cannula Placement Confirmation: positive ETCO2

## 2020-10-02 NOTE — Anesthesia Preprocedure Evaluation (Signed)
Anesthesia Evaluation  Patient identified by MRN, date of birth, ID band Patient awake    Reviewed: Allergy & Precautions, H&P , NPO status , Patient's Chart, lab work & pertinent test results, reviewed documented beta blocker date and time   Airway Mallampati: II  TM Distance: >3 FB Neck ROM: full    Dental no notable dental hx.    Pulmonary neg pulmonary ROS,    Pulmonary exam normal breath sounds clear to auscultation       Cardiovascular Exercise Tolerance: Good hypertension,  Rhythm:regular Rate:Normal     Neuro/Psych negative neurological ROS  negative psych ROS   GI/Hepatic Neg liver ROS, GERD  ,  Endo/Other  negative endocrine ROS  Renal/GU negative Renal ROS  negative genitourinary   Musculoskeletal   Abdominal   Peds  Hematology negative hematology ROS (+)   Anesthesia Other Findings   Reproductive/Obstetrics negative OB ROS                             Anesthesia Physical Anesthesia Plan  ASA: II  Anesthesia Plan: General   Post-op Pain Management:    Induction:   PONV Risk Score and Plan: 2 and Treatment may vary due to age or medical condition, TIVA and Propofol infusion  Airway Management Planned:   Additional Equipment:   Intra-op Plan:   Post-operative Plan:   Informed Consent: I have reviewed the patients History and Physical, chart, labs and discussed the procedure including the risks, benefits and alternatives for the proposed anesthesia with the patient or authorized representative who has indicated his/her understanding and acceptance.     Dental Advisory Given  Plan Discussed with: CRNA  Anesthesia Plan Comments:         Anesthesia Quick Evaluation

## 2020-10-02 NOTE — Anesthesia Postprocedure Evaluation (Signed)
Anesthesia Post Note  Patient: Jeffery Vasquez  Procedure(s) Performed: COLONOSCOPY WITH PROPOFOL (N/A ) ESOPHAGOGASTRODUODENOSCOPY (EGD) WITH PROPOFOL (N/A ) POLYPECTOMY     Patient location during evaluation: PACU Anesthesia Type: General Level of consciousness: awake and alert Pain management: pain level controlled Vital Signs Assessment: post-procedure vital signs reviewed and stable Respiratory status: spontaneous breathing, nonlabored ventilation, respiratory function stable and patient connected to nasal cannula oxygen Cardiovascular status: blood pressure returned to baseline and stable Postop Assessment: no apparent nausea or vomiting Anesthetic complications: no   No complications documented.  Leeona Mccardle, Glade Stanford

## 2020-10-02 NOTE — Op Note (Signed)
[facility] Gastroenterology Patient Name: Jeffery Vasquez Procedure Date: 10/02/2020 11:16 AM MRN: [id number] Account #: [id number] Date of Birth: 05-10-1946 Admit Type: Outpatient Age: 75 Room: [facility] OR ROOM 01 Gender: Male Note Status: Finalized Procedure:             Colonoscopy Indications:           High risk colon cancer surveillance: Personal history                         of colonic polyps Providers:             Lucilla Lame MD, MD Referring MD:          Ane Payment, MD (Referring MD) Medicines:             Propofol per Anesthesia Complications:         No immediate complications. Procedure:             Pre-Anesthesia Assessment:                        - Prior to the procedure, a History and Physical was                         performed, and patient medications and allergies were                         reviewed. The patient's tolerance of previous                         anesthesia was also reviewed. The risks and benefits                         of the procedure and the sedation options and risks                         were discussed with the patient. All questions were                         answered, and informed consent was obtained. Prior                         Anticoagulants: The patient has taken no previous                         anticoagulant or antiplatelet agents. ASA Grade                         Assessment: II - A patient with mild systemic disease.                         After reviewing the risks and benefits, the patient                         was deemed in satisfactory condition to undergo the                         procedure.  After obtaining informed consent, the colonoscope was                         passed under direct vision. Throughout the procedure,                         the patient's blood pressure, pulse, and oxygen                         saturations were monitored continuously. The                          Colonoscope was introduced through the anus and                         advanced to the the cecum, identified by appendiceal                         orifice and ileocecal valve. The colonoscopy was                         performed without difficulty. The patient tolerated                         the procedure well. The quality of the bowel                         preparation was excellent. Findings:      The perianal and digital rectal examinations were normal.      A 3 mm polyp was found in the transverse colon. The polyp was sessile.       The polyp was removed with a cold biopsy forceps. Resection and       retrieval were complete.      Two sessile polyps were found in the descending colon. The polyps were 3       to 4 mm in size. These polyps were removed with a cold biopsy forceps.       Resection and retrieval were complete.      Non-bleeding internal hemorrhoids were found during retroflexion. The       hemorrhoids were Grade II (internal hemorrhoids that prolapse but reduce       spontaneously).      Multiple small-mouthed diverticula were found in the sigmoid colon. Impression:            - One 3 mm polyp in the transverse colon, removed with                         a cold biopsy forceps. Resected and retrieved.                        - Two 3 to 4 mm polyps in the descending colon,                         removed with a cold biopsy forceps. Resected and                         retrieved.                        -  Non-bleeding internal hemorrhoids.                        - Diverticulosis in the sigmoid colon. Recommendation:        - Discharge patient to home.                        - Resume previous diet.                        - Continue present medications.                        - Await pathology results.                        - Repeat colonoscopy is not recommended due to current                         age (74 years or older) for surveillance. Procedure  Code(s):     --- Professional ---                        [phone], Colonoscopy, flexible; with biopsy, single or                         multiple Diagnosis Code(s):     --- Professional ---                        Z86.010, Personal history of colonic polyps                        K63.5, Polyp of colon CPT copyright 2019 American Medical Association. All rights reserved. The codes documented in this report are preliminary and upon coder review may  be revised to meet current compliance requirements. Lucilla Lame MD, MD 10/02/2020 11:45:39 AM This report has been signed electronically. Number of Addenda: 0 Note Initiated On: 10/02/2020 11:16 AM Scope Withdrawal Time: 0 hours 6 minutes 34 seconds  Total Procedure Duration: 0 hours 11 minutes 15 seconds  Estimated Blood Loss:  Estimated blood loss: none.      [facility]

## 2020-10-05 ENCOUNTER — Encounter: Payer: Self-pay | Admitting: Gastroenterology

## 2020-10-05 LAB — SURGICAL PATHOLOGY

## 2020-10-06 ENCOUNTER — Ambulatory Visit
Admission: EM | Admit: 2020-10-06 | Discharge: 2020-10-06 | Disposition: A | Discharge status: Home / Self Care | Payer: Medicare Other | Attending: Emergency Medicine | Admitting: Emergency Medicine

## 2020-10-06 ENCOUNTER — Other Ambulatory Visit: Payer: Self-pay

## 2020-10-06 DIAGNOSIS — R42 Dizziness and giddiness: Secondary | ICD-10-CM | POA: Diagnosis not present

## 2020-10-06 MED ORDER — MECLIZINE HCL 25 MG PO TABS: 25.0000 mg | ORAL_TABLET | Freq: Three times a day (TID) | ORAL | 0 refills | Status: DC | PRN

## 2020-10-06 MED ORDER — MECLIZINE HCL 25 MG PO TABS
25.0000 mg | ORAL_TABLET | Freq: Once | ORAL | Status: AC
  Administered 2020-10-06: 25 mg via ORAL

## 2020-10-06 NOTE — Discharge Instructions (Addendum)
Use the meclizine every 8 hours as needed for your dizziness symptoms.  Contact your neurologist and let them know that you were seen for dizziness, prescribed meclizine, and had improvement of your symptoms.  I would also reschedule your allergy test with your allergist.
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
## 2020-10-06 NOTE — ED Triage Notes (Signed)
Pt reports having L ear discomfort that began this morning. sts he feels off balance.

## 2020-11-04 DIAGNOSIS — H02209 Unspecified lagophthalmos unspecified eye, unspecified eyelid: Secondary | ICD-10-CM | POA: Diagnosis not present

## 2020-11-04 DIAGNOSIS — H532 Diplopia: Secondary | ICD-10-CM | POA: Diagnosis not present

## 2020-11-04 DIAGNOSIS — H2513 Age-related nuclear cataract, bilateral: Secondary | ICD-10-CM | POA: Diagnosis not present

## 2020-11-05 DIAGNOSIS — J301 Allergic rhinitis due to pollen: Secondary | ICD-10-CM | POA: Diagnosis not present

## 2020-11-11 DIAGNOSIS — J301 Allergic rhinitis due to pollen: Secondary | ICD-10-CM | POA: Diagnosis not present

## 2020-11-17 DIAGNOSIS — J301 Allergic rhinitis due to pollen: Secondary | ICD-10-CM | POA: Diagnosis not present

## 2020-11-20 DIAGNOSIS — J301 Allergic rhinitis due to pollen: Secondary | ICD-10-CM | POA: Diagnosis not present

## 2020-11-24 DIAGNOSIS — J301 Allergic rhinitis due to pollen: Secondary | ICD-10-CM | POA: Diagnosis not present

## 2020-11-27 DIAGNOSIS — J301 Allergic rhinitis due to pollen: Secondary | ICD-10-CM | POA: Diagnosis not present

## 2020-12-01 DIAGNOSIS — J301 Allergic rhinitis due to pollen: Secondary | ICD-10-CM | POA: Diagnosis not present

## 2020-12-04 DIAGNOSIS — J301 Allergic rhinitis due to pollen: Secondary | ICD-10-CM | POA: Diagnosis not present

## 2020-12-08 DIAGNOSIS — J301 Allergic rhinitis due to pollen: Secondary | ICD-10-CM | POA: Diagnosis not present

## 2020-12-15 DIAGNOSIS — J301 Allergic rhinitis due to pollen: Secondary | ICD-10-CM | POA: Diagnosis not present

## 2020-12-18 DIAGNOSIS — J301 Allergic rhinitis due to pollen: Secondary | ICD-10-CM | POA: Diagnosis not present

## 2020-12-22 DIAGNOSIS — J301 Allergic rhinitis due to pollen: Secondary | ICD-10-CM | POA: Diagnosis not present

## 2020-12-23 DIAGNOSIS — J301 Allergic rhinitis due to pollen: Secondary | ICD-10-CM | POA: Diagnosis not present

## 2020-12-25 DIAGNOSIS — J301 Allergic rhinitis due to pollen: Secondary | ICD-10-CM | POA: Diagnosis not present

## 2020-12-29 DIAGNOSIS — J301 Allergic rhinitis due to pollen: Secondary | ICD-10-CM | POA: Diagnosis not present

## 2021-01-05 DIAGNOSIS — J301 Allergic rhinitis due to pollen: Secondary | ICD-10-CM | POA: Diagnosis not present

## 2021-01-08 DIAGNOSIS — J301 Allergic rhinitis due to pollen: Secondary | ICD-10-CM | POA: Diagnosis not present

## 2021-01-12 DIAGNOSIS — J301 Allergic rhinitis due to pollen: Secondary | ICD-10-CM | POA: Diagnosis not present

## 2021-01-15 DIAGNOSIS — Z20822 Contact with and (suspected) exposure to covid-19: Secondary | ICD-10-CM | POA: Diagnosis not present

## 2021-01-15 DIAGNOSIS — J301 Allergic rhinitis due to pollen: Secondary | ICD-10-CM | POA: Diagnosis not present

## 2021-01-19 DIAGNOSIS — J301 Allergic rhinitis due to pollen: Secondary | ICD-10-CM | POA: Diagnosis not present

## 2021-01-21 DIAGNOSIS — J301 Allergic rhinitis due to pollen: Secondary | ICD-10-CM | POA: Diagnosis not present

## 2021-01-26 DIAGNOSIS — J301 Allergic rhinitis due to pollen: Secondary | ICD-10-CM | POA: Diagnosis not present

## 2021-01-27 DIAGNOSIS — J301 Allergic rhinitis due to pollen: Secondary | ICD-10-CM | POA: Diagnosis not present

## 2021-01-28 ENCOUNTER — Other Ambulatory Visit: Payer: Self-pay

## 2021-01-28 ENCOUNTER — Encounter: Payer: Self-pay | Admitting: Emergency Medicine

## 2021-01-28 ENCOUNTER — Ambulatory Visit (INDEPENDENT_AMBULATORY_CARE_PROVIDER_SITE_OTHER): Payer: Medicare Other

## 2021-01-28 ENCOUNTER — Ambulatory Visit
Admission: EM | Admit: 2021-01-28 | Discharge: 2021-01-28 | Disposition: A | Discharge status: Home / Self Care | Payer: Medicare Other | Attending: Emergency Medicine | Admitting: Emergency Medicine

## 2021-01-28 DIAGNOSIS — Z20822 Contact with and (suspected) exposure to covid-19: Secondary | ICD-10-CM | POA: Diagnosis not present

## 2021-01-28 DIAGNOSIS — R0600 Dyspnea, unspecified: Secondary | ICD-10-CM | POA: Diagnosis not present

## 2021-01-28 DIAGNOSIS — R062 Wheezing: Secondary | ICD-10-CM

## 2021-01-28 DIAGNOSIS — J22 Unspecified acute lower respiratory infection: Secondary | ICD-10-CM

## 2021-01-28 DIAGNOSIS — J209 Acute bronchitis, unspecified: Secondary | ICD-10-CM | POA: Diagnosis not present

## 2021-01-28 DIAGNOSIS — R059 Cough, unspecified: Secondary | ICD-10-CM | POA: Diagnosis not present

## 2021-01-28 MED ORDER — PREDNISONE 10 MG PO TABS: ORAL_TABLET | ORAL | 0 refills | Status: AC

## 2021-01-28 MED ORDER — ALBUTEROL SULFATE HFA 108 (90 BASE) MCG/ACT IN AERS: 1.0000 | INHALATION_SPRAY | Freq: Four times a day (QID) | RESPIRATORY_TRACT | 0 refills | Status: DC | PRN

## 2021-01-28 MED ORDER — BENZONATATE 100 MG PO CAPS: 100.0000 mg | ORAL_CAPSULE | Freq: Three times a day (TID) | ORAL | 0 refills | Status: DC

## 2021-01-28 NOTE — ED Triage Notes (Signed)
Pt presents today with c/o fever (102.0), cough and nasal congestion since yesterday. He is concerned that his wife gave him "community acquired pneumonia". He took home Covid test last night; negative.

## 2021-01-28 NOTE — Discharge Instructions (Addendum)
The majority of cases of acute bronchitis are caused by infection with respiratory viruses that do not require antibiotics.   Rest, push lots of fluids (especially water), and utilize supportive care for symptoms. You may take take acetaminophen (Tylenol) every 4-6 hours or ibuprofen every 6-8 hours for muscle pain, joint pain, headaches. Mucinex (guaifenesin) may be taken over the counter for cough as needed and can loosen phlegm. Please read the instructions and take as directed. Saline nasal sprays to rinse congestion can help as well. Warm tea with lemon and honey can sooth sore throat and cough, as can cough drops.  Take Tessalon Perles with Mucinex for cough.   The normal course of bronchitis is 1-3 weeks. Return to clinic for new-onset fever, difficulty breathing, chest pain, symptoms lasting >3 to 4 weeks, or bloody sputum.

## 2021-01-28 NOTE — ED Provider Notes (Addendum)
CHIEF COMPLAINT:   Chief Complaint  Patient presents with   Fever   Nasal Congestion   Cough     SUBJECTIVE/HPI:   Fever Associated symptoms: cough   Cough Associated symptoms: fever   A very pleasant 75 y.o.Male presents today with fever, nasal congestion and cough which started yesterday.  Patient reports a 67 F fever at home which broke this morning.  Patient states that he has used Mucinex and Tylenol which has helped some.  Patient states that his wife was recently diagnosed with pneumonia.  Patient reports yellow phlegm production.  Patient states he has a history of a collapsed right lung.  Patient states that he took a home COVID test which was negative. Patient does not report any shortness of breath, chest pain, palpitations, visual changes, weakness.   has a past medical history of Balance problem due to vestibular dysfunction of right ear, DDD (degenerative disc disease), lumbar, Diplopia (03/25/1992), Elevated PSA, Facial nerve injury, Fracture of trapezium (Dec. 2015), GERD (gastroesophageal reflux disease), History of acoustic neuroma, History of concussion, History of diverticulitis of colon, History of gastritis, History of kidney stones, History of pneumothorax, History of skin cancer, Hyperlipidemia, Lesion of soft tissue, Odynophagia, Peyronie's disease (2001), Sensorineural hearing loss of right ear, and Sinus drainage.  ROS:  Review of Systems  Constitutional:  Positive for fever.  Respiratory:  Positive for cough.   See Subjective/HPI Medications, Allergies and Problem List personally reviewed in Epic today OBJECTIVE:   Vitals:   01/28/21 1248  BP: (!) 104/59  Pulse: (!) 55  Resp: 18  Temp: 99.9 F (37.7 C)  SpO2: 95%    Physical Exam   General: Appears well-developed and well-nourished. No acute distress.  HEENT Head: Normocephalic and atraumatic.   Ears: Hearing grossly intact, no drainage or visible deformity.  Nose: No nasal deviation.   Mouth/Throat: No stridor or tracheal deviation.  Non erythematous posterior pharynx noted with clear drainage present.  No white patchy exudate noted. Eyes: Conjunctivae and EOM are normal. No eye drainage or scleral icterus bilaterally.  Neck: Normal range of motion, neck is supple.  Cardiovascular: Normal rate. Regular rhythm; no murmurs, gallops, or rubs.  Pulm/Chest: No respiratory distress. Breath sounds normal bilaterally without wheezes, rhonchi, or rales.  Neurological: Alert and oriented to person, place, and time.  Skin: Skin is warm and dry.  No rashes, lesions, abrasions or bruising noted to skin.   Psychiatric: Normal mood, affect, behavior, and thought content.   Vital signs and nursing note reviewed.   Patient stable and cooperative with examination. PROCEDURES:    LABS/X-RAYS/EKG/MEDS:   No results found for any visits on 01/28/21. Chest: As read by me, no pneumothorax or pneumonia.  Overread pending. MEDICAL DECISION MAKING:   Patient presents with fever, nasal congestion and cough which started yesterday.  Patient reports a 79 F fever at home which broke this morning.  Patient states that he has used Mucinex and Tylenol which has helped some.  Patient states that his wife was recently diagnosed with pneumonia.  Patient reports yellow phlegm production.  Patient states he has a history of a collapsed right lung.  Patient states that he took a home COVID test which was negative. Patient does not report any shortness of breath, chest pain, palpitations, visual changes, weakness.  Chart review completed.  Given symptoms along with assessment findings, likely lower respiratory tract infection.  Rx'd Tessalon, prednisone and Ventolin to the patient's preferred pharmacy and advised about home treatment and  care along with strict return precautions for worsening of symptoms.  Chest reveals no pneumothorax.  Return as needed.  X-ray over read was concerning for pneumonia or lower  respiratory tract infection. Rx'd doxycycline to the patient's pharmacy given overread findings.  Patient verbalized understanding and agreed with treatment plan.  Patient stable upon discharge. ASSESSMENT/PLAN:  1. Lower respiratory tract infection  Meds ordered this encounter  Medications   predniSONE (DELTASONE) 10 MG tablet    Sig: Take 6 tablets (60 mg total) by mouth daily for 1 day, THEN 5 tablets (50 mg total) daily for 1 day, THEN 4 tablets (40 mg total) daily for 1 day, THEN 3 tablets (30 mg total) daily for 1 day, THEN 2 tablets (20 mg total) daily for 1 day, THEN 1 tablet (10 mg total) daily for 1 day.    Dispense:  21 tablet    Refill:  0    Order Specific Question:   Supervising Provider    Answer:   Bari Mantis   albuterol (VENTOLIN HFA) 108 (90 Base) MCG/ACT inhaler    Sig: Inhale 1-2 puffs into the lungs every 6 (six) hours as needed (cough).    Dispense:  6.7 g    Refill:  0    Order Specific Question:   Supervising Provider    Answer:   Chase Picket [id number]   benzonatate (TESSALON) 100 MG capsule    Sig: Take 1 capsule (100 mg total) by mouth every 8 (eight) hours.    Dispense:  21 capsule    Refill:  0    Order Specific Question:   Supervising Provider    Answer:   Chase Picket [id number]    Instructions about new medications and side effects provided.  Plan:   Discharge Instructions      The majority of cases of acute bronchitis are caused by infection with respiratory viruses that do not require antibiotics.   Rest, push lots of fluids (especially water), and utilize supportive care for symptoms. You may take take acetaminophen (Tylenol) every 4-6 hours or ibuprofen every 6-8 hours for muscle pain, joint pain, headaches. Mucinex (guaifenesin) may be taken over the counter for cough as needed and can loosen phlegm. Please read the instructions and take as directed. Saline nasal sprays to rinse congestion can help as well. Warm tea  with lemon and honey can sooth sore throat and cough, as can cough drops.  Take Tessalon Perles with Mucinex for cough.   The normal course of bronchitis is 1-3 weeks. Return to clinic for new-onset fever, difficulty breathing, chest pain, symptoms lasting >3 to 4 weeks, or bloody sputum.        A copy of these instructions have been given to the patient or responsible adult who demonstrated the ability to learn, asked appropriate questions, and verbalized understanding of the plan of care.  There were no barriers to learning identified.    Serafina Royals, FNP-C 01/28/21  This note was partially made with the aid of speech-to-text dictation; typographical errors are not intentional.    Serafina Royals, [place] 01/28/21 [place], [place], [place] 01/28/21 [place], [place], [place] 01/28/21 1604

## 2021-01-29 DIAGNOSIS — J301 Allergic rhinitis due to pollen: Secondary | ICD-10-CM | POA: Diagnosis not present

## 2021-01-29 LAB — SARS-COV-2, NAA 2 DAY TAT

## 2021-01-29 LAB — NOVEL CORONAVIRUS, NAA: SARS-CoV-2, NAA: DETECTED — AB

## 2021-02-09 DIAGNOSIS — J301 Allergic rhinitis due to pollen: Secondary | ICD-10-CM | POA: Diagnosis not present

## 2021-02-12 DIAGNOSIS — J301 Allergic rhinitis due to pollen: Secondary | ICD-10-CM | POA: Diagnosis not present

## 2021-02-16 DIAGNOSIS — J301 Allergic rhinitis due to pollen: Secondary | ICD-10-CM | POA: Diagnosis not present

## 2021-02-18 DIAGNOSIS — J301 Allergic rhinitis due to pollen: Secondary | ICD-10-CM | POA: Diagnosis not present

## 2021-02-18 DIAGNOSIS — H903 Sensorineural hearing loss, bilateral: Secondary | ICD-10-CM | POA: Diagnosis not present

## 2021-02-18 DIAGNOSIS — K120 Recurrent oral aphthae: Secondary | ICD-10-CM | POA: Diagnosis not present

## 2021-02-19 DIAGNOSIS — J301 Allergic rhinitis due to pollen: Secondary | ICD-10-CM | POA: Diagnosis not present

## 2021-02-23 DIAGNOSIS — J301 Allergic rhinitis due to pollen: Secondary | ICD-10-CM | POA: Diagnosis not present

## 2021-02-26 DIAGNOSIS — J301 Allergic rhinitis due to pollen: Secondary | ICD-10-CM | POA: Diagnosis not present

## 2021-03-02 DIAGNOSIS — J301 Allergic rhinitis due to pollen: Secondary | ICD-10-CM | POA: Diagnosis not present

## 2021-03-09 DIAGNOSIS — J301 Allergic rhinitis due to pollen: Secondary | ICD-10-CM | POA: Diagnosis not present

## 2021-03-10 ENCOUNTER — Ambulatory Visit (INDEPENDENT_AMBULATORY_CARE_PROVIDER_SITE_OTHER): Payer: Medicare Other | Admitting: Urology

## 2021-03-10 ENCOUNTER — Encounter: Payer: Self-pay | Admitting: Urology

## 2021-03-10 ENCOUNTER — Other Ambulatory Visit: Payer: Self-pay

## 2021-03-10 VITALS — BP 104/59 | HR 69 | Ht 67.0 in | Wt 170.0 lb

## 2021-03-10 DIAGNOSIS — R310 Gross hematuria: Secondary | ICD-10-CM

## 2021-03-10 DIAGNOSIS — N3000 Acute cystitis without hematuria: Secondary | ICD-10-CM

## 2021-03-10 LAB — BLADDER SCAN AMB NON-IMAGING

## 2021-03-10 MED ORDER — CIPROFLOXACIN HCL 500 MG PO TABS: 500.0000 mg | ORAL_TABLET | Freq: Two times a day (BID) | ORAL | 0 refills | Status: DC

## 2021-03-10 NOTE — Progress Notes (Signed)
   03/10/2021 2:44 PM   Jeffery Vasquez [birth year] [id number]  Reason for visit: Possible UTI  HPI: 75 year old male with a number of urologic issues who was previously followed by Dr. Karsten Ro in [place].  He has a history of a PVP in 2014 for BPH and removal of bladder stone, history of gross hematuria with recent negative cystoscopy within the last year with Dr. Karsten Ro, and history of an inflatable penile prosthesis that remains functional.  Prostate measures 113 g from CT in 2020.  He presents today with 3 to 4 days of dysuria and urinary frequency/urgency.  He denies any flank pain, fevers, or chills.  On exam circumcised phallus with no lesions, patent meatus, penile prosthesis in place with no tenderness of the implant or pump.  Urinalysis today with 6-10 WBCs, 3-10 RBCs, calcium oxalate crystals, no bacteria, no yeast, nitrite negative, no leukocytes.  Will send for culture.  Cipro 500 mg twice daily x7 days for possible UTI/prostatitis, call with culture results Consider cystoscopy if no improvement on antibiotics   [name], [name] [address] [name], [name] [address] [phone]

## 2021-03-11 LAB — URINALYSIS, COMPLETE
Bilirubin, UA: NEGATIVE
Glucose, UA: NEGATIVE
Leukocytes,UA: NEGATIVE
Nitrite, UA: NEGATIVE
Specific Gravity, UA: 1.025 (ref 1.005–1.030)
Urobilinogen, Ur: 1 mg/dL (ref 0.2–1.0)
pH, UA: 7 (ref 5.0–7.5)

## 2021-03-11 LAB — MICROSCOPIC EXAMINATION: Bacteria, UA: NONE SEEN

## 2021-03-15 LAB — CULTURE, URINE COMPREHENSIVE

## 2021-03-16 DIAGNOSIS — J301 Allergic rhinitis due to pollen: Secondary | ICD-10-CM | POA: Diagnosis not present

## 2021-03-17 LAB — MYCOPLASMA / UREAPLASMA CULTURE
Mycoplasma hominis Culture: NEGATIVE
Ureaplasma urealyticum: NEGATIVE

## 2021-03-19 DIAGNOSIS — H1131 Conjunctival hemorrhage, right eye: Secondary | ICD-10-CM | POA: Diagnosis not present

## 2021-03-19 DIAGNOSIS — H02209 Unspecified lagophthalmos unspecified eye, unspecified eyelid: Secondary | ICD-10-CM | POA: Diagnosis not present

## 2021-03-19 DIAGNOSIS — H532 Diplopia: Secondary | ICD-10-CM | POA: Diagnosis not present

## 2021-03-19 DIAGNOSIS — J301 Allergic rhinitis due to pollen: Secondary | ICD-10-CM | POA: Diagnosis not present

## 2021-03-19 DIAGNOSIS — H2513 Age-related nuclear cataract, bilateral: Secondary | ICD-10-CM | POA: Diagnosis not present

## 2021-03-19 DIAGNOSIS — H40033 Anatomical narrow angle, bilateral: Secondary | ICD-10-CM | POA: Diagnosis not present

## 2021-03-29 DIAGNOSIS — J301 Allergic rhinitis due to pollen: Secondary | ICD-10-CM | POA: Diagnosis not present

## 2021-04-05 DIAGNOSIS — J301 Allergic rhinitis due to pollen: Secondary | ICD-10-CM | POA: Diagnosis not present

## 2021-04-13 DIAGNOSIS — J301 Allergic rhinitis due to pollen: Secondary | ICD-10-CM | POA: Diagnosis not present

## 2021-04-17 DIAGNOSIS — N3010 Interstitial cystitis (chronic) without hematuria: Secondary | ICD-10-CM | POA: Diagnosis not present

## 2021-04-17 DIAGNOSIS — Z79899 Other long term (current) drug therapy: Secondary | ICD-10-CM | POA: Diagnosis not present

## 2021-04-17 DIAGNOSIS — N486 Induration penis plastica: Secondary | ICD-10-CM | POA: Diagnosis not present

## 2021-04-17 DIAGNOSIS — N400 Enlarged prostate without lower urinary tract symptoms: Secondary | ICD-10-CM | POA: Diagnosis not present

## 2021-04-17 DIAGNOSIS — R319 Hematuria, unspecified: Secondary | ICD-10-CM | POA: Diagnosis not present

## 2021-04-17 DIAGNOSIS — Z881 Allergy status to other antibiotic agents status: Secondary | ICD-10-CM | POA: Diagnosis not present

## 2021-04-17 DIAGNOSIS — R9341 Abnormal radiologic findings on diagnostic imaging of renal pelvis, ureter, or bladder: Secondary | ICD-10-CM | POA: Diagnosis not present

## 2021-04-17 DIAGNOSIS — K219 Gastro-esophageal reflux disease without esophagitis: Secondary | ICD-10-CM | POA: Diagnosis not present

## 2021-04-17 DIAGNOSIS — Z7982 Long term (current) use of aspirin: Secondary | ICD-10-CM | POA: Diagnosis not present

## 2021-04-20 DIAGNOSIS — J301 Allergic rhinitis due to pollen: Secondary | ICD-10-CM | POA: Diagnosis not present

## 2021-04-27 ENCOUNTER — Ambulatory Visit (INDEPENDENT_AMBULATORY_CARE_PROVIDER_SITE_OTHER): Payer: Medicare Other | Admitting: Urology

## 2021-04-27 ENCOUNTER — Other Ambulatory Visit
Admission: RE | Admit: 2021-04-27 | Discharge: 2021-04-27 | Disposition: A | Discharge status: Home / Self Care | Payer: Medicare Other | Attending: Urology | Admitting: Urology

## 2021-04-27 ENCOUNTER — Other Ambulatory Visit: Payer: Self-pay | Admitting: *Deleted

## 2021-04-27 ENCOUNTER — Encounter: Payer: Self-pay | Admitting: Urology

## 2021-04-27 ENCOUNTER — Other Ambulatory Visit: Payer: Self-pay

## 2021-04-27 VITALS — BP 152/93 | HR 65 | Ht 67.0 in | Wt 177.0 lb

## 2021-04-27 DIAGNOSIS — J301 Allergic rhinitis due to pollen: Secondary | ICD-10-CM | POA: Diagnosis not present

## 2021-04-27 DIAGNOSIS — R310 Gross hematuria: Secondary | ICD-10-CM

## 2021-04-27 DIAGNOSIS — N138 Other obstructive and reflux uropathy: Secondary | ICD-10-CM

## 2021-04-27 DIAGNOSIS — N401 Enlarged prostate with lower urinary tract symptoms: Secondary | ICD-10-CM | POA: Diagnosis not present

## 2021-04-27 LAB — URINALYSIS, COMPLETE (UACMP) WITH MICROSCOPIC
Bilirubin Urine: NEGATIVE
Glucose, UA: NEGATIVE mg/dL
Hgb urine dipstick: NEGATIVE
Ketones, ur: NEGATIVE mg/dL
Leukocytes,Ua: NEGATIVE
Nitrite: NEGATIVE
Protein, ur: 100 mg/dL — AB
Specific Gravity, Urine: 1.025 (ref 1.005–1.030)
pH: 5.5 (ref 5.0–8.0)

## 2021-04-27 LAB — BLADDER SCAN AMB NON-IMAGING

## 2021-04-27 NOTE — Progress Notes (Signed)
   04/27/2021 9:14 AM   Jeffery Vasquez 12-09-1945 [id number]  Reason for visit: Gross hematuria, BPH  HPI: 75 year-old male with a number of urologic issues who was previously followed by Dr. Karsten Ro in [place].  He has a history of a PVP in 2014 for BPH and removal of bladder stone, history of gross hematuria with reported recent negative cystoscopy within the last 2 years with Dr. Karsten Ro, and history of an inflatable penile prosthesis that remains functional.   At our last visit on 03/10/2021 he was having prostatitis type symptoms with dysuria, and urinary frequency/urgency and was treated with 7 days of Cipro.  Urine culture and atypical culture were ultimately negative.  He presented to [facility] ED on 04/17/2021 with painless gross hematuria and small clots.  Urinalysis was benign at that time aside from greater than 100 RBCs, and urine culture showed no growth.  I personally viewed and interpreted the CT abdomen and pelvis without contrast that shows a 142 g prostate with subtle calcifications at the bladder neck and some irregularity at the trigone.  He does report some weakening stream and difficulty urinating.  PVR is normal today at 45 mL, urinalysis today pending.  We discussed the need for further evaluation with cystoscopy. We discussed common possible etiologies of hematuria including BPH, malignancy, urolithiasis, medical renal disease, and idiopathic. Standard workup recommended by the AUA includes imaging with CT urogram to assess the upper tracts, and cystoscopy. Cytology is performed on patient's with gross hematuria to look for malignant cells in the urine.  Follow-up for cystoscopy, likely pursue cystolitholapaxy and HOLEP pending cystoscopy findings        [name], MD  [facility] [address], [place], [place] [address] [phone]

## 2021-04-27 NOTE — Patient Instructions (Addendum)
Cystoscopy Cystoscopy is a procedure that is used to help diagnose and sometimes treat conditions that affect the lower urinary tract. The lower urinary tract includes the bladder and the urethra. The urethra is the tube that drains urine from the bladder. Cystoscopy is done using a thin, tube-shaped instrument with a light and camera at the end (cystoscope). The cystoscope may be hard or flexible, depending on the goal of the procedure. The cystoscope is inserted through the urethra, into the bladder. Cystoscopy may be recommended if you have: Urinary tract infections that keep coming back. Blood in the urine (hematuria). An inability to control when you urinate (urinary incontinence) or an overactive bladder. Unusual cells found in a urine sample. A blockage in the urethra, such as a urinary stone. Painful urination. An abnormality in the bladder found during an intravenous pyelogram (IVP) or CT scan. What are the risks? Generally, this is a safe procedure. However, problems may occur, including: Infection. Bleeding.  What happens during the procedure?  You will be given one or more of the following: A medicine to numb the area (local anesthetic). The area around the opening of your urethra will be cleaned. The cystoscope will be passed through your urethra into your bladder. Germ-free (sterile) fluid will flow through the cystoscope to fill your bladder. The fluid will stretch your bladder so that your health care provider can clearly examine your bladder walls. Your doctor will look at the urethra and bladder. The cystoscope will be removed The procedure may vary among health care providers  What can I expect after the procedure? After the procedure, it is common to have: Some soreness or pain in your urethra. Urinary symptoms. These include: Mild pain or burning when you urinate. Pain should stop within a few minutes after you urinate. This may last for up to a few days after the  procedure. A small amount of blood in your urine for several days. Feeling like you need to urinate but producing only a small amount of urine. Follow these instructions at home: General instructions Return to your normal activities as told by your health care provider.  Drink plenty of fluids after the procedure. Keep all follow-up visits as told by your health care provider. This is important. Contact a health care provider if you: Have pain that gets worse or does not get better with medicine, especially pain when you urinate lasting longer than 72 hours after the procedure. Have trouble urinating. Get help right away if you: Have blood clots in your urine. Have a fever or chills. Are unable to urinate. Summary Cystoscopy is a procedure that is used to help diagnose and sometimes treat conditions that affect the lower urinary tract. Cystoscopy is done using a thin, tube-shaped instrument with a light and camera at the end. After the procedure, it is common to have some soreness or pain in your urethra. It is normal to have blood in your urine after the procedure.  If you were prescribed an antibiotic medicine, take it as told by your health care provider.  This information is not intended to replace advice given to you by your health care provider. Make sure you discuss any questions you have with your health care provider. Document Revised: 06/12/2018 Document Reviewed: 06/12/2018 Elsevier Patient Education  2020 Else[employer] Inc.  Holmium Laser Enucleation of the Prostate (HoLEP)  HoLEP is a treatment for men with benign prostatic hyperplasia (BPH). The laser surgery removed blockages of urine flow, and is done without any   incisions on the body.     What is HoLEP?  HoLEP is a type of laser surgery used to treat obstruction (blockage) of urine flow as a result of benign prostatic hyperplasia (BPH). In men with BPH, the prostate gland is not cancerous, but has become enlarged. An  enlarged prostate can result in a number of urinary tract symptoms such as weak urinary stream, difficulty in starting urination, inability to urinate, frequent urination, or getting up at night to urinate.  HoLEP was developed in the 1990's as a more effective and less expensive surgical option for BPH, compared to other surgical options such as laser vaporization(PVP/greenlight laser), transurethral resection of the prostate(TURP), and open simple prostatectomy.   What happens during a HoLEP?  HoLEP requires general anesthesia ("asleep" throughout the procedure).   An antibiotic is given to reduce the risk of infection  A surgical instrument called a resectoscope is inserted through the urethra (the tube that carries urine from the bladder). The resectoscope has a camera that allows the surgeon to view the internal structure of the prostate gland, and to see where the incisions are being made during surgery.  The laser is inserted into the resectoscope and is used to enucleate (free up) the enlarged prostate tissue from the capsule (outer shell) and then to seal up any blood vessels. The tissue that has been removed is pushed back into the bladder.  A morcellator is placed through the resectoscope, and is used to suction out the prostate tissue that has been pushed into the bladder.  When the prostate tissue has been removed, the resectoscope is removed, and a foley catheter is placed to allow healing and drain the urine from the bladder.     What happens after a HoLEP?  More than 90% of patients go home the same day a few hours after surgery. Less than 10% will be admitted to the hospital overnight for observation to monitor the urine, or if they have other medical problems.  Fluid is flushed through the catheter for about 1 hour after surgery to clear any blood from the urine. It is normal to have some blood in the urine after surgery. The need for blood transfusion is extremely rare.   Eating and drinking are permitted after the procedure once the patient has fully awakened from anesthesia.  The catheter is usually removed 2-3 days after surgery- the patient will come to clinic to have the catheter removed and make sure they can urinate on their own.  It is very important to drink lots of fluids after surgery for one week to keep the bladder flushed.  At first, there may be some burning with urination, but this typically improved within a few hours to days. Most patients do not have a significant amount of pain, and narcotic pain medications are rarely needed.  Symptoms of urinary frequency, urgency, and even leakage are NORMAL for the first few weeks after surgery as the bladder adjusts after having to work hard against blockage from the prostate for many years. This will improve, but can sometimes take several months.  The use of pelvic floor exercises (Kegel exercises) can help improve problems with urinary incontinence.   After catheter removal, patients will be seen at 6 weeks and 6 months for symptom check  No heavy lifting for at least 2-3 weeks after surgery, however patients can walk and do light activities the first day after surgery. Return to work time depends on occupation.    What are   the advantages of HoLEP?  HoLEP has been studied in many different parts of the world and has been shown to be a safe and effective procedure. Although there are many types of BPH surgeries available, HoLEP offers a unique advantage in being able to remove a large amount of tissue without any incisions on the body, even in very large prostates, while decreasing the risk of bleeding and providing tissue for pathology (to look for cancer). This decreases the need for blood transfusions during surgery, minimizes hospital stay, and reduces the risk of needing repeat treatment.  What are the side effects of HoLEP?  Temporary burning and bleeding during urination. Some blood may be  seen in the urine for weeks after surgery and is part of the healing process.  Urinary incontinence (inability to control urine flow) is expected in all patients immediately after surgery and they should wear pads for the first few days/weeks. This typically improves over the course of several weeks. Performing Kegel exercises can help decrease leakage from stress maneuvers such as coughing, sneezing, or lifting. The rate of long term leakage is very low. Patients may also have leakage with urgency and this may be treated with medication. The risk of urge incontinence can be dependent on several factors including age, prostate size, symptoms, and other medical problems.  Retrograde ejaculation or "backwards ejaculation." In 75% of cases, the patient will not see any fluid during ejaculation after surgery.  Erectile function is generally not significantly affected.   What are the risks of HoLEP?  Injury to the urethra or development of scar tissue at a later date  Injury to the capsule of the prostate (typically treated with longer catheterization).  Injury to the bladder or ureteral orifices (where the urine from the kidney drains out)  Infection of the bladder, testes, or kidneys  Return of urinary obstruction at a later date requiring another operation (<2%)  Need for blood transfusion or re-operation due to bleeding  Failure to relieve all symptoms and/or need for prolonged catheterization after surgery  5-15% of patients are found to have previously undiagnosed prostate cancer in their specimen. Prostate cancer can be treated after HoLEP.  Standard risks of anesthesia including blood clots, heart attacks, etc  When should I call my doctor?  Fever over 101.3 degrees  Inability to urinate, or large blood clots in the urine   

## 2021-05-04 DIAGNOSIS — J301 Allergic rhinitis due to pollen: Secondary | ICD-10-CM | POA: Diagnosis not present

## 2021-05-06 ENCOUNTER — Ambulatory Visit (INDEPENDENT_AMBULATORY_CARE_PROVIDER_SITE_OTHER): Payer: Medicare Other | Admitting: Urology

## 2021-05-06 ENCOUNTER — Other Ambulatory Visit: Payer: Self-pay | Admitting: Urology

## 2021-05-06 ENCOUNTER — Other Ambulatory Visit: Payer: Self-pay

## 2021-05-06 ENCOUNTER — Telehealth: Payer: Self-pay | Admitting: Urology

## 2021-05-06 ENCOUNTER — Encounter: Payer: Self-pay | Admitting: Urology

## 2021-05-06 VITALS — HR 65 | Ht 67.0 in | Wt 177.0 lb

## 2021-05-06 DIAGNOSIS — N401 Enlarged prostate with lower urinary tract symptoms: Secondary | ICD-10-CM

## 2021-05-06 DIAGNOSIS — N138 Other obstructive and reflux uropathy: Secondary | ICD-10-CM

## 2021-05-06 DIAGNOSIS — N210 Calculus in bladder: Secondary | ICD-10-CM

## 2021-05-06 DIAGNOSIS — R310 Gross hematuria: Secondary | ICD-10-CM | POA: Diagnosis not present

## 2021-05-06 NOTE — Telephone Encounter (Signed)
Per Dr. Diamantina Providence Patient is to be scheduled for [facility] and Cystolitholapaxy  Mr. Mchargue was contacted and possible surgical dates were discussed, 07/09/2021 was agreed upon for surgery. Patient was instructed that Dr. Diamantina Providence will require them to provide a pre-op UA & CX prior to surgery. This was ordered and scheduled drop off appointment was made for 06/21/2021 at 10:30 am.   Patient was directed to call [phone] between 1-3pm the day before surgery to find out surgical arrival time.  Instructions were given not to eat or drink from midnight on the night before surgery and have a driver for the day of surgery. On the surgery day patient was instructed to enter through the [place] entrance of [facility] report the Same Day Surgery desk.   Pre-Admit Testing will be in contact via phone to set up an interview with the anesthesia team to review your history and medications prior to surgery.   Reminder of this information was sent via Mychart to the patient.   Patient is to hold anticoag's and ASA per Dr. Diamantina Providence.

## 2021-05-06 NOTE — Patient Instructions (Signed)

## 2021-05-06 NOTE — Progress Notes (Signed)
[facility] Posting Form   Surgery Date/Time: Date: 07/09/2021  Surgeon: Dr. Nickolas Madrid, MD  Surgery Location: Day Surgery  Inpt ( No  )   Outpt (Yes)   Obs ( No  )   Diagnosis: N40.1,N13.8 BPH with BOO; N21.0 Bladder Stone  -CPT: 06893   Surgery: Cystolitholapaxy  -CPT: [phone]  Surgery: HOLEP  Stop Anticoagulations: No  Cardiac/Medical/Pulmonary Clearance needed: No   *Orders entered into EPIC  Date: 05/06/21   *Case booked in [place]  Date: 05/06/21  *Notified pt of Surgery: Date: 05/06/21  PRE-OP UA & CX: Yes  *Placed into Prior Authorization Work [place] Date: 05/06/21   Assistant/laser/rep:No

## 2021-05-06 NOTE — Progress Notes (Signed)
Surgical Physician Order Form  * Scheduling expectation : Next Available  *Length of Case: 2 hours  *Clearance needed: no  *Anticoagulation Instructions: Hold all anticoagulants  *Aspirin Instructions: Hold Aspirin  *Post-op visit Date/Instructions:   2-day Foley removal, 4 months PVR with MD  *Diagnosis: BPH w/BOO, bladder stone  *Procedure: Cystolitholapaxy <2.5cm (48016) and HOLEP  -Admit type: OUTpatient  -Anesthesia: General  -VTE Prophylaxis Standing Order SCD's       Other:   -Standing Lab Orders Per Anesthesia    Lab other: UA&Urine Culture  -Standing Test orders EKG/Chest x-ray per Anesthesia       Test other:   - Medications:     Cipro 400mg  IV   Other Instructions:

## 2021-05-06 NOTE — Progress Notes (Signed)
Cystoscopy Procedure Note:  Indication: BPH, bladder stones, gross hematuria  After informed consent and discussion of the procedure and its risks, Jeffery Vasquez was positioned and prepped in the standard fashion. Cystoscopy was performed with a flexible cystoscope. The urethra, bladder neck and entire bladder was visualized in a standard fashion. The prostate was large with obstructing lobes and significant regrowth of adenoma.  There was a 2 cm stone at the base of the bladder, and 2 other smaller stones.  Imaging: CT with bladder stones and 140 g prostate  Findings: Large prostate with significant regrowth of adenoma, bladder stones, no suspicious bladder lesions  --------------------------------------------------------------------------------  Assessment and Plan: 75 year old male with distant history of PVP and recurrent urinary symptoms with weak stream and feeling of incomplete emptying, recurrent gross hematuria, and CT and cystoscopy showing significant prostatic regrowth measuring 140 g, with multiple bladder stones.  We reviewed options including cystolitholapaxy, intermittent catheterization, HOLEP, or prostatic artery embolization.  We discussed the risks and benefits of HoLEP at length.  The procedure requires general anesthesia and takes 1 to 2 hours, and a holmium laser is used to enucleate the prostate and push this tissue into the bladder.  A morcellator is then used to remove this tissue, which is sent for pathology.  The vast majority(>95%) of patients are able to discharge the same day with a catheter in place for 2 to 3 days, and will follow-up in clinic for a voiding trial.  We specifically discussed the risks of bleeding, infection, retrograde ejaculation, temporary urgency and urge incontinence, very low risk of long-term incontinence, urethral stricture/bladder neck contracture, pathologic evaluation of prostate tissue and possible detection of prostate cancer or other  malignancy, and possible need for additional procedures.  We also reviewed risk of damage to inflatable penile prosthesis and potential need for further procedures in the future.  Schedule cystolitholapaxy and HOLEP  Nickolas Madrid, MD 05/06/2021

## 2021-05-07 DIAGNOSIS — H40033 Anatomical narrow angle, bilateral: Secondary | ICD-10-CM | POA: Diagnosis not present

## 2021-05-07 DIAGNOSIS — H02209 Unspecified lagophthalmos unspecified eye, unspecified eyelid: Secondary | ICD-10-CM | POA: Diagnosis not present

## 2021-05-07 DIAGNOSIS — H2513 Age-related nuclear cataract, bilateral: Secondary | ICD-10-CM | POA: Diagnosis not present

## 2021-05-07 DIAGNOSIS — H532 Diplopia: Secondary | ICD-10-CM | POA: Diagnosis not present

## 2021-05-11 DIAGNOSIS — J301 Allergic rhinitis due to pollen: Secondary | ICD-10-CM | POA: Diagnosis not present

## 2021-05-12 DIAGNOSIS — J301 Allergic rhinitis due to pollen: Secondary | ICD-10-CM | POA: Diagnosis not present

## 2021-05-18 DIAGNOSIS — J301 Allergic rhinitis due to pollen: Secondary | ICD-10-CM | POA: Diagnosis not present

## 2021-05-25 DIAGNOSIS — J301 Allergic rhinitis due to pollen: Secondary | ICD-10-CM | POA: Diagnosis not present

## 2021-06-08 DIAGNOSIS — J301 Allergic rhinitis due to pollen: Secondary | ICD-10-CM | POA: Diagnosis not present

## 2021-06-10 DIAGNOSIS — Z20822 Contact with and (suspected) exposure to covid-19: Secondary | ICD-10-CM | POA: Diagnosis not present

## 2021-06-21 ENCOUNTER — Other Ambulatory Visit: Payer: Medicare Other

## 2021-06-21 ENCOUNTER — Other Ambulatory Visit: Payer: Self-pay

## 2021-06-21 DIAGNOSIS — N210 Calculus in bladder: Secondary | ICD-10-CM | POA: Diagnosis not present

## 2021-06-21 DIAGNOSIS — N401 Enlarged prostate with lower urinary tract symptoms: Secondary | ICD-10-CM

## 2021-06-21 DIAGNOSIS — N138 Other obstructive and reflux uropathy: Secondary | ICD-10-CM | POA: Diagnosis not present

## 2021-06-21 LAB — URINALYSIS, COMPLETE
Bilirubin, UA: NEGATIVE
Glucose, UA: NEGATIVE
Ketones, UA: NEGATIVE
Nitrite, UA: NEGATIVE
Specific Gravity, UA: 1.025 (ref 1.005–1.030)
Urobilinogen, Ur: 0.2 mg/dL (ref 0.2–1.0)
pH, UA: 5.5 (ref 5.0–7.5)

## 2021-06-21 LAB — MICROSCOPIC EXAMINATION
Bacteria, UA: NONE SEEN
Epithelial Cells (non renal): NONE SEEN /hpf (ref 0–10)

## 2021-06-22 DIAGNOSIS — J301 Allergic rhinitis due to pollen: Secondary | ICD-10-CM | POA: Diagnosis not present

## 2021-06-24 LAB — CULTURE, URINE COMPREHENSIVE

## 2021-06-29 ENCOUNTER — Other Ambulatory Visit
Admission: RE | Admit: 2021-06-29 | Discharge: 2021-06-29 | Disposition: A | Discharge status: Home / Self Care | Payer: Medicare Other | Source: Ambulatory Visit | Attending: Urology | Admitting: Urology

## 2021-06-29 ENCOUNTER — Other Ambulatory Visit: Payer: Self-pay

## 2021-06-29 DIAGNOSIS — J301 Allergic rhinitis due to pollen: Secondary | ICD-10-CM | POA: Diagnosis not present

## 2021-06-29 NOTE — Patient Instructions (Signed)
Your procedure is scheduled on: Friday July 09, 2020. Report to Day Surgery inside [facility] 2nd floor. To find out your arrival time please call [phone] between 1PM - 3PM on Thursday July 08, 2020.  Remember: Instructions that are not followed completely may result in serious medical risk,  up to and including death, or upon the discretion of your surgeon and anesthesiologist your  surgery may need to be rescheduled.     _X__ 1. Do not eat food or drink fluids after midnight the night before your procedure.                 No chewing gum or hard candies.   __X__2.  On the morning of surgery brush your teeth with toothpaste and water, you                may rinse your mouth with mouthwash if you wish.  Do not swallow any toothpaste or mouthwash.     _X__ 3.  No Alcohol for 24 hours before or after surgery.   _X__ 4.  Do Not Smoke or use e-cigarettes For 24 Hours Prior to Your Surgery.                 Do not use any chewable tobacco products for at least 6 hours prior to                 Surgery.  _X__  5.  Do not use any recreational drugs (marijuana, cocaine, heroin, ecstasy, MDMA or other)                For at least one week prior to your surgery.  Combination of these drugs with anesthesia                May have life threatening results.  _X___6.  Notify your doctor if there is any change in your medical condition      (cold, fever, infections).     Do not wear jewelry, make-up, hairpins, clips or nail polish. Do not wear lotions, powders, or perfumes. You may wear deodorant. Do not shave 48 hours prior to surgery. Men may shave face and neck. Do not bring valuables to the hospital.    [facility] is not responsible for any belongings or valuables.  Contacts, dentures or bridgework may not be worn into surgery. Leave your suitcase in the car. After surgery it may be brought to your room. For patients admitted to the hospital, discharge time  is determined by your treatment team.   Patients discharged the day of surgery will not be allowed to drive home.   Make arrangements for someone to be with you for the first 24 hours of your Same Day Discharge.   __X__ Take these medicines the morning of surgery with A SIP OF WATER:    1. None   2.   3.   4.  5.  6.  ____ Fleet Enema (as directed)   __X__ Shower the morning of your procedure as normal.  ____ Use Benzoyl Peroxide Gel as instructed  ____ Use inhalers on the day of surgery  ____ Stop metformin 2 days prior to surgery    ____ Take 1/2 of usual insulin dose the night before surgery. No insulin the morning          of surgery.   ____ Call your PCP, cardiologist, or Pulmonologist if taking Coumadin/Plavix/aspirin and ask when to stop before your surgery.  __X__ One Week prior to surgery- Stop Anti-inflammatories such as Ibuprofen, Aleve, Advil, Motrin, meloxicam (MOBIC), diclofenac, etodolac, ketorolac, Toradol, Daypro, piroxicam, Goody's or BC powders. OK TO USE TYLENOL IF NEEDED   __X__  supplements until after surgery.    ____ Bring C-Pap to the hospital.    If you have any questions regarding your pre-procedure instructions,  Please call Pre-admit Testing at [phone]

## 2021-06-30 ENCOUNTER — Other Ambulatory Visit
Admission: RE | Admit: 2021-06-30 | Discharge: 2021-06-30 | Disposition: A | Discharge status: Home / Self Care | Payer: Medicare Other | Source: Ambulatory Visit | Attending: Urology | Admitting: Urology

## 2021-06-30 DIAGNOSIS — Z01810 Encounter for preprocedural cardiovascular examination: Secondary | ICD-10-CM | POA: Insufficient documentation

## 2021-07-06 DIAGNOSIS — J301 Allergic rhinitis due to pollen: Secondary | ICD-10-CM | POA: Diagnosis not present

## 2021-07-09 ENCOUNTER — Encounter
Admission: RE | Disposition: A | Discharge status: Home / Self Care | Payer: Self-pay | Source: Home / Self Care | Attending: Urology

## 2021-07-09 ENCOUNTER — Ambulatory Visit: Payer: Medicare Other | Admitting: Certified Registered Nurse Anesthetist

## 2021-07-09 ENCOUNTER — Encounter: Payer: Self-pay | Admitting: Urology

## 2021-07-09 ENCOUNTER — Ambulatory Visit
Admission: RE | Admit: 2021-07-09 | Discharge: 2021-07-09 | Disposition: A | Discharge status: Home / Self Care | Payer: Medicare Other | Attending: Urology | Admitting: Urology

## 2021-07-09 ENCOUNTER — Other Ambulatory Visit: Payer: Self-pay

## 2021-07-09 ENCOUNTER — Ambulatory Visit: Payer: Medicare Other

## 2021-07-09 DIAGNOSIS — K219 Gastro-esophageal reflux disease without esophagitis: Secondary | ICD-10-CM | POA: Diagnosis not present

## 2021-07-09 DIAGNOSIS — N401 Enlarged prostate with lower urinary tract symptoms: Secondary | ICD-10-CM | POA: Diagnosis not present

## 2021-07-09 DIAGNOSIS — N210 Calculus in bladder: Secondary | ICD-10-CM | POA: Diagnosis not present

## 2021-07-09 DIAGNOSIS — N138 Other obstructive and reflux uropathy: Secondary | ICD-10-CM | POA: Insufficient documentation

## 2021-07-09 DIAGNOSIS — R310 Gross hematuria: Secondary | ICD-10-CM | POA: Diagnosis not present

## 2021-07-09 DIAGNOSIS — G510 Bell's palsy: Secondary | ICD-10-CM | POA: Insufficient documentation

## 2021-07-09 DIAGNOSIS — R339 Retention of urine, unspecified: Secondary | ICD-10-CM | POA: Insufficient documentation

## 2021-07-09 HISTORY — PX: HOLEP-LASER ENUCLEATION OF THE PROSTATE WITH MORCELLATION: SHX6641

## 2021-07-09 SURGERY — ENUCLEATION, PROSTATE, USING LASER, WITH MORCELLATION
Anesthesia: General

## 2021-07-09 MED ORDER — DEXAMETHASONE SODIUM PHOSPHATE 10 MG/ML IJ SOLN
INTRAMUSCULAR | Status: AC
  Filled 2021-07-09: qty 1

## 2021-07-09 MED ORDER — FENTANYL CITRATE (PF) 100 MCG/2ML IJ SOLN: 25.0000 ug | INTRAMUSCULAR | Status: DC | PRN

## 2021-07-09 MED ORDER — ONDANSETRON HCL 4 MG/2ML IJ SOLN
INTRAMUSCULAR | Status: DC | PRN
Start: 2021-07-09 — End: 2021-07-09
  Administered 2021-07-09: 4 mg via INTRAVENOUS

## 2021-07-09 MED ORDER — ACETAMINOPHEN 10 MG/ML IV SOLN
INTRAVENOUS | Status: DC | PRN
  Administered 2021-07-09: 1000 mg via INTRAVENOUS

## 2021-07-09 MED ORDER — ACETAMINOPHEN 10 MG/ML IV SOLN
INTRAVENOUS | Status: AC
  Filled 2021-07-09: qty 100

## 2021-07-09 MED ORDER — SEVOFLURANE IN SOLN
RESPIRATORY_TRACT | Status: AC
  Filled 2021-07-09: qty 250

## 2021-07-09 MED ORDER — ROCURONIUM BROMIDE 10 MG/ML (PF) SYRINGE
PREFILLED_SYRINGE | INTRAVENOUS | Status: AC
  Filled 2021-07-09: qty 10

## 2021-07-09 MED ORDER — SUGAMMADEX SODIUM 200 MG/2ML IV SOLN
INTRAVENOUS | Status: DC | PRN
  Administered 2021-07-09: 154.2 mg via INTRAVENOUS

## 2021-07-09 MED ORDER — EPHEDRINE SULFATE 50 MG/ML IJ SOLN
INTRAMUSCULAR | Status: DC | PRN
  Administered 2021-07-09 (×3): 5 mg via INTRAVENOUS

## 2021-07-09 MED ORDER — CHLORHEXIDINE GLUCONATE 0.12 % MT SOLN: 15.0000 mL | Freq: Once | OROMUCOSAL | Status: AC

## 2021-07-09 MED ORDER — ONDANSETRON HCL 4 MG/2ML IJ SOLN
INTRAMUSCULAR | Status: AC
  Filled 2021-07-09: qty 2

## 2021-07-09 MED ORDER — PROPOFOL 10 MG/ML IV BOLUS
INTRAVENOUS | Status: DC | PRN
  Administered 2021-07-09: 110 mg via INTRAVENOUS

## 2021-07-09 MED ORDER — PROPOFOL 10 MG/ML IV BOLUS
INTRAVENOUS | Status: AC
  Filled 2021-07-09: qty 60

## 2021-07-09 MED ORDER — ONDANSETRON HCL 4 MG/2ML IJ SOLN: 4.0000 mg | Freq: Once | INTRAMUSCULAR | Status: DC | PRN

## 2021-07-09 MED ORDER — GLYCOPYRROLATE 0.2 MG/ML IJ SOLN
INTRAMUSCULAR | Status: AC
  Filled 2021-07-09: qty 1

## 2021-07-09 MED ORDER — BELLADONNA ALKALOIDS-OPIUM 16.2-60 MG RE SUPP
RECTAL | Status: DC | PRN
  Administered 2021-07-09: 1 via RECTAL

## 2021-07-09 MED ORDER — ROCURONIUM BROMIDE 100 MG/10ML IV SOLN
INTRAVENOUS | Status: DC | PRN
  Administered 2021-07-09 (×2): 5 mg via INTRAVENOUS
  Administered 2021-07-09: 10 mg via INTRAVENOUS
  Administered 2021-07-09: 50 mg via INTRAVENOUS

## 2021-07-09 MED ORDER — FAMOTIDINE 20 MG PO TABS: 20.0000 mg | ORAL_TABLET | Freq: Once | ORAL | Status: AC

## 2021-07-09 MED ORDER — CIPROFLOXACIN IN D5W 400 MG/200ML IV SOLN: 400.0000 mg | INTRAVENOUS | Status: AC

## 2021-07-09 MED ORDER — SULFAMETHOXAZOLE-TRIMETHOPRIM 800-160 MG PO TABS: 1.0000 | ORAL_TABLET | Freq: Every day | ORAL | 0 refills | Status: DC

## 2021-07-09 MED ORDER — FAMOTIDINE 20 MG PO TABS
ORAL_TABLET | ORAL | Status: AC
  Administered 2021-07-09: 20 mg via ORAL
  Filled 2021-07-09: qty 1

## 2021-07-09 MED ORDER — FENTANYL CITRATE (PF) 100 MCG/2ML IJ SOLN
INTRAMUSCULAR | Status: AC
  Filled 2021-07-09: qty 2

## 2021-07-09 MED ORDER — FENTANYL CITRATE (PF) 100 MCG/2ML IJ SOLN
INTRAMUSCULAR | Status: DC | PRN
  Administered 2021-07-09 (×2): 50 ug via INTRAVENOUS

## 2021-07-09 MED ORDER — BELLADONNA ALKALOIDS-OPIUM 16.2-60 MG RE SUPP
RECTAL | Status: AC
  Filled 2021-07-09: qty 1

## 2021-07-09 MED ORDER — LIDOCAINE HCL (PF) 2 % IJ SOLN
INTRAMUSCULAR | Status: AC
  Filled 2021-07-09: qty 5

## 2021-07-09 MED ORDER — DEXAMETHASONE SODIUM PHOSPHATE 10 MG/ML IJ SOLN
INTRAMUSCULAR | Status: DC | PRN
  Administered 2021-07-09: 5 mg via INTRAVENOUS

## 2021-07-09 MED ORDER — SODIUM CHLORIDE 0.9 % IR SOLN
Status: DC | PRN
  Administered 2021-07-09: 27000 mL via INTRAVESICAL

## 2021-07-09 MED ORDER — LIDOCAINE HCL (CARDIAC) PF 100 MG/5ML IV SOSY
PREFILLED_SYRINGE | INTRAVENOUS | Status: DC | PRN
  Administered 2021-07-09: 100 mg via INTRAVENOUS

## 2021-07-09 MED ORDER — CHLORHEXIDINE GLUCONATE 0.12 % MT SOLN
OROMUCOSAL | Status: AC
  Administered 2021-07-09: 15 mL via OROMUCOSAL
  Filled 2021-07-09: qty 15

## 2021-07-09 MED ORDER — HYDROCODONE-ACETAMINOPHEN 5-325 MG PO TABS: 1.0000 | ORAL_TABLET | ORAL | 0 refills | Status: AC | PRN

## 2021-07-09 MED ORDER — CIPROFLOXACIN IN D5W 400 MG/200ML IV SOLN
INTRAVENOUS | Status: AC
  Administered 2021-07-09: 400 mg via INTRAVENOUS
  Filled 2021-07-09: qty 200

## 2021-07-09 MED ORDER — ORAL CARE MOUTH RINSE: 15.0000 mL | Freq: Once | OROMUCOSAL | Status: AC

## 2021-07-09 MED ORDER — SODIUM CHLORIDE 0.9 % IV SOLN: INTRAVENOUS | Status: DC

## 2021-07-09 MED ORDER — LACTATED RINGERS IV SOLN: INTRAVENOUS | Status: DC

## 2021-07-09 SURGICAL SUPPLY — 45 items
ADAPTER IRRIG TUBE 2 SPIKE SOL (ADAPTER) ×4 IMPLANT
ADPR TBG 2 SPK PMP STRL ASCP (ADAPTER) ×2
BAG DRAIN CYSTO-URO LG1000N (MISCELLANEOUS) ×2 IMPLANT
BAG DRN LRG CPC RND TRDRP CNTR (MISCELLANEOUS) ×1
BAG URO DRAIN 4000ML (MISCELLANEOUS) ×2 IMPLANT
CATH FOLEY 3WAY 30CC 24FR (CATHETERS) ×2
CATH URETL OPEN END 4X70 (CATHETERS) ×2 IMPLANT
CATH URTH STD 24FR FL 3W 2 (CATHETERS) ×1 IMPLANT
CNTNR SPEC 2.5X3XGRAD LEK (MISCELLANEOUS)
CONT SPEC 4OZ STER OR WHT (MISCELLANEOUS)
CONT SPEC 4OZ STRL OR WHT (MISCELLANEOUS)
CONTAINER COLLECT MORCELLATR (MISCELLANEOUS) ×1 IMPLANT
CONTAINER SPEC 2.5X3XGRAD LEK (MISCELLANEOUS) IMPLANT
DRAPE UTILITY 15X26 TOWEL STRL (DRAPES) IMPLANT
ELECT BIVAP BIPO 22/24 DONUT (ELECTROSURGICAL)
ELECTRD BIVAP BIPO 22/24 DONUT (ELECTROSURGICAL) IMPLANT
FIBER LASER FLEXIVA PULSE 550 (Laser) ×2 IMPLANT
FIBER LASER MOSES 550 DFL (Laser) ×1 IMPLANT
FILTER OVERFLOW MORCELLATOR (FILTER) ×1 IMPLANT
GAUZE 4X4 16PLY ~~LOC~~+RFID DBL (SPONGE) ×4 IMPLANT
GLOVE SURG UNDER POLY LF SZ7.5 (GLOVE) ×2 IMPLANT
GOWN STRL REUS W/ TWL LRG LVL3 (GOWN DISPOSABLE) ×1 IMPLANT
GOWN STRL REUS W/ TWL XL LVL3 (GOWN DISPOSABLE) ×1 IMPLANT
GOWN STRL REUS W/TWL LRG LVL3 (GOWN DISPOSABLE) ×2
GOWN STRL REUS W/TWL XL LVL3 (GOWN DISPOSABLE) ×2
HOLDER FOLEY CATH W/STRAP (MISCELLANEOUS) ×2 IMPLANT
IV NS IRRIG 3000ML ARTHROMATIC (IV SOLUTION) ×15 IMPLANT
KIT PROBE TRILOGY 3.9X350 (MISCELLANEOUS) IMPLANT
KIT TURNOVER CYSTO (KITS) ×2 IMPLANT
MANIFOLD NEPTUNE II (INSTRUMENTS) ×2 IMPLANT
MBRN O SEALING YLW 17 FOR INST (MISCELLANEOUS) ×2
MEMBRANE SLNG YLW 17 FOR INST (MISCELLANEOUS) ×1 IMPLANT
MORCELLATOR COLLECT CONTAINER (MISCELLANEOUS) ×2
MORCELLATOR OVERFLOW FILTER (FILTER) ×2
MORCELLATOR ROTATION 4.75 335 (MISCELLANEOUS) ×2 IMPLANT
PACK CYSTO AR (MISCELLANEOUS) ×2 IMPLANT
SET CYSTO W/LG BORE CLAMP LF (SET/KITS/TRAYS/PACK) ×2 IMPLANT
SET IRRIG Y TYPE TUR BLADDER L (SET/KITS/TRAYS/PACK) ×2 IMPLANT
SLEEVE PROTECTION STRL DISP (MISCELLANEOUS) ×4 IMPLANT
SURGILUBE 2OZ TUBE FLIPTOP (MISCELLANEOUS) ×2 IMPLANT
SYR TOOMEY IRRIG 70ML (MISCELLANEOUS) ×2
SYRINGE TOOMEY IRRIG 70ML (MISCELLANEOUS) ×1 IMPLANT
TUBE PUMP MORCELLATOR PIRANHA (TUBING) ×2 IMPLANT
WATER STERILE IRR 1000ML POUR (IV SOLUTION) ×2 IMPLANT
WATER STERILE IRR 500ML POUR (IV SOLUTION) ×2 IMPLANT
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
## 2021-07-09 NOTE — Transfer of Care (Signed)
Immediate Anesthesia Transfer of Care Note  Patient: Jeffery Vasquez  Procedure(s) Performed: HOLEP-LASER ENUCLEATION OF THE PROSTATE WITH MORCELLATION  Patient Location: PACU  Anesthesia Type:General  Level of Consciousness: awake, alert  and oriented  Airway & Oxygen Therapy: Patient Spontanous Breathing and Patient connected to nasal cannula oxygen  Post-op Assessment: Report given to RN and Post -op Vital signs reviewed and stable  Post vital signs: Reviewed and stable  Last Vitals:  Vitals Value Taken Time  BP 154/65 07/09/21 0915  Temp 36.3 C 07/09/21 0915  Pulse 69 07/09/21 0920  Resp 13 07/09/21 0920  SpO2 100 % 07/09/21 0920  Vitals shown include unvalidated device data.  Last Pain:  Vitals:   07/09/21 7670  TempSrc: Oral  PainSc: 0-No pain         Complications: No notable events documented.

## 2021-07-09 NOTE — Discharge Instructions (Addendum)
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
## 2021-07-09 NOTE — Op Note (Signed)
Date of procedure: 07/09/21  Preoperative diagnosis:  BPH with obstruction Gross hematuria Bladder stone  Postoperative diagnosis:  Same  Procedure: HoLEP (Holmium Laser Enucleation of the Prostate)  Surgeon: Nickolas Madrid, MD  Anesthesia: General  Complications: None  Intraoperative findings:  Very large prostate with irregular regrowth of trilobar adenoma after distant history of PVP Trigone bullous, but able to identify ureteral orifices bilaterally, mild to moderate trabeculations Multiple small bladder stones evacuated through scope Uncomplicated HOLEP  EBL: 25 mL  Specimens: Prostate chips  Enucleation time: 53 minutes  Morcellation time: 12 minutes  Intra-op weight: 80 g  Drains: 24 French three-way, 60 cc in balloon  Indication: Jeffery Vasquez is a 76 y.o. patient with long history of BPH and large prostate with recurrent obstructive symptoms, gross hematuria.  He has a distant history of a greenlight PVP.  After reviewing the management options for treatment, they elected to proceed with the above surgical procedure(s). We have discussed the potential benefits and risks of the procedure, side effects of the proposed treatment, the likelihood of the patient achieving the goals of the procedure, and any potential problems that might occur during the procedure or recuperation.  We specifically discussed the risks of bleeding, infection, hematuria and clot retention, need for additional procedures, possible overnight hospital stay, temporary urgency and incontinence, rare long-term incontinence, and retrograde ejaculation.  Informed consent has been obtained.   Description of procedure:  The patient was taken to the operating room and general anesthesia was induced.  The patient was placed in the dorsal lithotomy position, prepped and draped in the usual sterile fashion, and preoperative antibiotics(Cipro) were administered.  SCDs were placed for DVT prophylaxis.  A  preoperative time-out was performed.   Leander Rams sounds were used to gently dilated the urethra up to 33F. The 74 French continuous flow resectoscope was inserted into the urethra using the visual obturator  The prostate was very large with trilobar hyperplasia and irregular regrowth after history of PVP. The bladder was thoroughly inspected and notable for mild to moderate trabeculations, but no suspicious lesions.  There were multiple small bladder stones, which were able to be evacuated through the scope.  The ureteral orifices were located in orthotopic position, and slightly difficult to identify secondary to the trigone being bullous and irregular.  The laser was set to 2 J and 60 Hz and was used to make a lambda incision just proximal to the verumontanum down to the level of the capsule.  A 5 and 7 o'clock incision was then made down to the level of the capsule from the bladder neck to the verumontanum, and the median lobe was enucleated.  The lateral lobes were then incised circumferentially until they were disconnected from the surrounding tissue.  The capsule was examined and laser was used for meticulous hemostasis.    The 59 French resectoscope was then switched out for the 9 French nephroscope and the lobes were morcellated and the tissue sent to pathology.  A 24 French three-way catheter was inserted easily with the aid of a catheter guide, and 60 cc were placed in the balloon.  Urine was pink.  The catheter irrigated easily with a Toomey syringe.  CBI was initiated. A belladonna suppository was placed.  The patient tolerated the procedure well without any immediate complications and was extubated and transferred to the recovery room in stable condition.  Urine was clear on fast CBI.  Disposition: Stable to PACU  Plan: Wean CBI in PACU, anticipate  discharge home today with Foley removal in clinic in 2-3 days   Nickolas Madrid, MD 07/09/2021

## 2021-07-09 NOTE — Anesthesia Procedure Notes (Signed)
Procedure Name: Intubation Date/Time: 07/09/2021 7:37 AM Performed by: Demetrius Charity, CRNA Pre-anesthesia Checklist: Patient identified, Patient being monitored, Timeout performed, Emergency Drugs available and Suction available Patient Re-evaluated:Patient Re-evaluated prior to induction Oxygen Delivery Method: Circle system utilized Preoxygenation: Pre-oxygenation with 100% oxygen Induction Type: IV induction Ventilation: Mask ventilation without difficulty and Oral airway inserted - appropriate to patient size Laryngoscope Size: 4 and McGraph Grade View: Grade I Tube type: Oral Tube size: 7.0 mm Number of attempts: 1 Airway Equipment and Method: Stylet and Video-laryngoscopy Placement Confirmation: ETT inserted through vocal cords under direct vision, positive ETCO2 and breath sounds checked- equal and bilateral Secured at: 22 cm Tube secured with: Tape Dental Injury: Teeth and Oropharynx as per pre-operative assessment

## 2021-07-12 ENCOUNTER — Ambulatory Visit: Payer: Medicare Other | Admitting: Physician Assistant

## 2021-07-13 ENCOUNTER — Ambulatory Visit (INDEPENDENT_AMBULATORY_CARE_PROVIDER_SITE_OTHER): Payer: Medicare Other | Admitting: Urology

## 2021-07-13 ENCOUNTER — Encounter: Payer: Self-pay | Admitting: Urology

## 2021-07-13 ENCOUNTER — Other Ambulatory Visit: Payer: Self-pay

## 2021-07-13 VITALS — Ht 67.0 in | Wt 169.0 lb

## 2021-07-13 DIAGNOSIS — J301 Allergic rhinitis due to pollen: Secondary | ICD-10-CM | POA: Diagnosis not present

## 2021-07-13 DIAGNOSIS — N138 Other obstructive and reflux uropathy: Secondary | ICD-10-CM

## 2021-07-13 DIAGNOSIS — N401 Enlarged prostate with lower urinary tract symptoms: Secondary | ICD-10-CM

## 2021-07-13 NOTE — Progress Notes (Signed)
Catheter Removal  Patient is present today for a catheter removal.  41ml of water was drained from the balloon. A 24FR foley cath was removed from the bladder no complications were noted . Patient tolerated well.  Performed by: Edwin Dada, [place]

## 2021-07-13 NOTE — Progress Notes (Signed)
° °  07/13/2021 3:04 PM   Jeffery Vasquez 12/08/1945 [id number]  Reason for visit: Follow up HOLEP  HPI: 76 year old male with BPH and urinary symptoms including recurrent gross hematuria who underwent an uncomplicated HOLEP on 03/09/7288.  He denies any problems over the weekend.  Foley catheter was removed today.  We discussed postoperative expectations including hematuria, dysuria, urgency/frequency, and incontinence.  Kegel exercises encouraged.  RTC 4 months PVR  [name] [address], [place], [place] [address] [phone]

## 2021-07-14 LAB — SURGICAL PATHOLOGY

## 2021-07-14 NOTE — Anesthesia Postprocedure Evaluation (Signed)
Anesthesia Post Note  Patient: Jeffery Vasquez  Procedure(s) Performed: HOLEP-LASER ENUCLEATION OF THE PROSTATE WITH MORCELLATION  Patient location during evaluation: PACU Anesthesia Type: General Level of consciousness: awake and alert Pain management: pain level controlled Vital Signs Assessment: post-procedure vital signs reviewed and stable Respiratory status: spontaneous breathing, nonlabored ventilation, respiratory function stable and patient connected to nasal cannula oxygen Cardiovascular status: blood pressure returned to baseline and stable Postop Assessment: no apparent nausea or vomiting Anesthetic complications: no   No notable events documented.   Last Vitals:  Vitals:   07/09/21 1018 07/09/21 1049  BP: (!) 161/71 (!) 163/80  Pulse: (!) 52 60  Resp: 16 18  Temp: (!) 35.7 C (!) 35.9 C  SpO2: 100% 100%    Last Pain:  Vitals:   07/09/21 1049  TempSrc: Temporal  PainSc: [name]

## 2021-07-20 DIAGNOSIS — J301 Allergic rhinitis due to pollen: Secondary | ICD-10-CM | POA: Diagnosis not present

## 2021-07-27 DIAGNOSIS — J301 Allergic rhinitis due to pollen: Secondary | ICD-10-CM | POA: Diagnosis not present

## 2021-08-03 DIAGNOSIS — J301 Allergic rhinitis due to pollen: Secondary | ICD-10-CM | POA: Diagnosis not present

## 2021-08-04 ENCOUNTER — Telehealth: Payer: Self-pay | Admitting: Urology

## 2021-08-04 DIAGNOSIS — J301 Allergic rhinitis due to pollen: Secondary | ICD-10-CM | POA: Diagnosis not present

## 2021-08-04 NOTE — Telephone Encounter (Signed)
Pt is having issues since his surgery on 1/6.  Pt is complaining burning and hurting.  He says he's real sore.

## 2021-08-05 NOTE — Telephone Encounter (Signed)
Called pt, per [facility] advised that he come into office for UA/CX to evaluate for infection. Pt scheduled.

## 2021-08-06 ENCOUNTER — Encounter: Payer: Self-pay | Admitting: Urology

## 2021-08-06 ENCOUNTER — Other Ambulatory Visit: Payer: Self-pay

## 2021-08-06 ENCOUNTER — Ambulatory Visit (INDEPENDENT_AMBULATORY_CARE_PROVIDER_SITE_OTHER): Payer: Medicare Other | Admitting: Urology

## 2021-08-06 VITALS — BP 157/87 | HR 60 | Ht 67.0 in | Wt 169.0 lb

## 2021-08-06 DIAGNOSIS — N401 Enlarged prostate with lower urinary tract symptoms: Secondary | ICD-10-CM

## 2021-08-06 DIAGNOSIS — N138 Other obstructive and reflux uropathy: Secondary | ICD-10-CM

## 2021-08-06 LAB — URINALYSIS, COMPLETE
Bilirubin, UA: NEGATIVE
Glucose, UA: NEGATIVE
Ketones, UA: NEGATIVE
Nitrite, UA: NEGATIVE
Specific Gravity, UA: >1.03 — ABNORMAL HIGH (ref 1.005–1.030)
Urobilinogen, Ur: 0.2 mg/dL (ref 0.2–1.0)
pH, UA: 5.5 (ref 5.0–7.5)

## 2021-08-06 LAB — BLADDER SCAN AMB NON-IMAGING: Scan Result: 23

## 2021-08-06 LAB — MICROSCOPIC EXAMINATION: WBC, UA: >30 /hpf — AB (ref 0–5)
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
## 2021-08-10 DIAGNOSIS — J301 Allergic rhinitis due to pollen: Secondary | ICD-10-CM | POA: Diagnosis not present

## 2021-08-12 LAB — CULTURE, URINE COMPREHENSIVE

## 2021-08-17 ENCOUNTER — Ambulatory Visit: Payer: Medicare Other | Admitting: Urology

## 2021-08-17 DIAGNOSIS — J301 Allergic rhinitis due to pollen: Secondary | ICD-10-CM | POA: Diagnosis not present

## 2021-08-24 DIAGNOSIS — J301 Allergic rhinitis due to pollen: Secondary | ICD-10-CM | POA: Diagnosis not present

## 2021-08-25 DIAGNOSIS — H903 Sensorineural hearing loss, bilateral: Secondary | ICD-10-CM | POA: Diagnosis not present

## 2021-08-25 DIAGNOSIS — R42 Dizziness and giddiness: Secondary | ICD-10-CM | POA: Diagnosis not present

## 2021-08-25 DIAGNOSIS — J301 Allergic rhinitis due to pollen: Secondary | ICD-10-CM | POA: Diagnosis not present

## 2021-08-31 DIAGNOSIS — J301 Allergic rhinitis due to pollen: Secondary | ICD-10-CM | POA: Diagnosis not present

## 2021-09-01 DIAGNOSIS — H90A21 Sensorineural hearing loss, unilateral, right ear, with restricted hearing on the contralateral side: Secondary | ICD-10-CM | POA: Diagnosis not present

## 2021-09-14 DIAGNOSIS — J301 Allergic rhinitis due to pollen: Secondary | ICD-10-CM | POA: Diagnosis not present

## 2021-09-18 DIAGNOSIS — Z20822 Contact with and (suspected) exposure to covid-19: Secondary | ICD-10-CM | POA: Diagnosis not present

## 2021-09-21 ENCOUNTER — Other Ambulatory Visit: Payer: Self-pay

## 2021-09-21 DIAGNOSIS — J301 Allergic rhinitis due to pollen: Secondary | ICD-10-CM | POA: Diagnosis not present

## 2021-09-22 DIAGNOSIS — R051 Acute cough: Secondary | ICD-10-CM | POA: Diagnosis not present

## 2021-09-22 DIAGNOSIS — J22 Unspecified acute lower respiratory infection: Secondary | ICD-10-CM | POA: Diagnosis not present

## 2021-09-22 DIAGNOSIS — R6889 Other general symptoms and signs: Secondary | ICD-10-CM | POA: Diagnosis not present

## 2021-09-22 DIAGNOSIS — I10 Essential (primary) hypertension: Secondary | ICD-10-CM | POA: Diagnosis not present

## 2021-09-22 DIAGNOSIS — I498 Other specified cardiac arrhythmias: Secondary | ICD-10-CM | POA: Diagnosis not present

## 2021-09-22 DIAGNOSIS — B349 Viral infection, unspecified: Secondary | ICD-10-CM | POA: Diagnosis not present

## 2021-09-22 DIAGNOSIS — K219 Gastro-esophageal reflux disease without esophagitis: Secondary | ICD-10-CM | POA: Diagnosis not present

## 2021-10-01 DIAGNOSIS — J301 Allergic rhinitis due to pollen: Secondary | ICD-10-CM | POA: Diagnosis not present

## 2021-10-05 DIAGNOSIS — J301 Allergic rhinitis due to pollen: Secondary | ICD-10-CM | POA: Diagnosis not present

## 2021-10-14 DIAGNOSIS — R059 Cough, unspecified: Secondary | ICD-10-CM | POA: Diagnosis not present

## 2021-10-14 DIAGNOSIS — R051 Acute cough: Secondary | ICD-10-CM | POA: Diagnosis not present

## 2021-10-14 DIAGNOSIS — Z20822 Contact with and (suspected) exposure to covid-19: Secondary | ICD-10-CM | POA: Diagnosis not present

## 2021-10-15 DIAGNOSIS — J301 Allergic rhinitis due to pollen: Secondary | ICD-10-CM | POA: Diagnosis not present

## 2021-10-18 DIAGNOSIS — Z20822 Contact with and (suspected) exposure to covid-19: Secondary | ICD-10-CM | POA: Diagnosis not present

## 2021-10-22 DIAGNOSIS — S5001XA Contusion of right elbow, initial encounter: Secondary | ICD-10-CM | POA: Diagnosis not present

## 2021-10-22 DIAGNOSIS — S80811A Abrasion, right lower leg, initial encounter: Secondary | ICD-10-CM | POA: Diagnosis not present

## 2021-10-22 DIAGNOSIS — S8011XA Contusion of right lower leg, initial encounter: Secondary | ICD-10-CM | POA: Diagnosis not present

## 2021-10-22 DIAGNOSIS — Z881 Allergy status to other antibiotic agents status: Secondary | ICD-10-CM | POA: Diagnosis not present

## 2021-10-22 DIAGNOSIS — J301 Allergic rhinitis due to pollen: Secondary | ICD-10-CM | POA: Diagnosis not present

## 2021-10-22 DIAGNOSIS — S8001XA Contusion of right knee, initial encounter: Secondary | ICD-10-CM | POA: Diagnosis not present

## 2021-10-22 DIAGNOSIS — S50311A Abrasion of right elbow, initial encounter: Secondary | ICD-10-CM | POA: Diagnosis not present

## 2021-10-25 DIAGNOSIS — J301 Allergic rhinitis due to pollen: Secondary | ICD-10-CM | POA: Diagnosis not present

## 2021-10-29 DIAGNOSIS — J301 Allergic rhinitis due to pollen: Secondary | ICD-10-CM | POA: Diagnosis not present

## 2021-11-03 ENCOUNTER — Encounter: Payer: Self-pay | Admitting: Urology

## 2021-11-03 ENCOUNTER — Ambulatory Visit (INDEPENDENT_AMBULATORY_CARE_PROVIDER_SITE_OTHER): Payer: Medicare Other | Admitting: Urology

## 2021-11-03 ENCOUNTER — Encounter: Payer: Medicare Other | Admitting: Urology

## 2021-11-03 VITALS — BP 165/81 | HR 76 | Ht 67.0 in | Wt 177.0 lb

## 2021-11-03 DIAGNOSIS — N138 Other obstructive and reflux uropathy: Secondary | ICD-10-CM | POA: Diagnosis not present

## 2021-11-03 DIAGNOSIS — R310 Gross hematuria: Secondary | ICD-10-CM | POA: Diagnosis not present

## 2021-11-03 DIAGNOSIS — N401 Enlarged prostate with lower urinary tract symptoms: Secondary | ICD-10-CM

## 2021-11-03 LAB — BLADDER SCAN AMB NON-IMAGING

## 2021-11-03 NOTE — Progress Notes (Signed)
? ?  11/03/2021 ?8:43 AM  ? ?Jeffery Vasquez ?Jan 08, 1946 ?[id number] ? ?Reason for visit: Follow up BPH, gross hematuria, status post HOLEP ? ?HPI: ?76 year old male with distant history of PVP who developed recurrent urinary symptoms as well as recurrent gross hematuria and ultimately opted for HOLEP.  He underwent an uncomplicated HOLEP on 12/04/3352 with removal of multiple small bladder stones and 88 g of benign prostate tissue.  He reportedly had a UTI and left-sided orchitis in March that was treated with antibiotics at the [place], those records are not available to me.  He reports resolution of testicular pain and denies any dysuria or gross hematuria. He had a CT abdomen and pelvis with contrast for abdominal pain on 09/17/2021 that showed no hydronephrosis, ureteral or bladder stones, bladder was decompressed, and there were no other significant abnormalities. ? ?Overall, he is doing well.  He is urinating with a strong stream and leakage has continued to improve.  He is having minimal leakage over the last few weeks. ? ?Encouraged to continue Kegel exercises ?RTC 1 year PVR ? ?[name], MD ? ?[place] ?[address] ?[place], [place] [address] ?[phone] ? ?

## 2021-11-03 NOTE — Patient Instructions (Signed)
Kegel Exercises  Kegel exercises can help strengthen your pelvic floor muscles. The pelvic floor is a group of muscles that support your rectum, small intestine, and bladder. In females, pelvic floor muscles also help support the uterus. These muscles help you control the flow of urine and stool (feces). Kegel exercises are painless and simple. They do not require any equipment. Your provider may suggest Kegel exercises to: Improve bladder and bowel control. Improve sexual response. Improve weak pelvic floor muscles after surgery to remove the uterus (hysterectomy) or after pregnancy, in females. Improve weak pelvic floor muscles after prostate gland removal or surgery, in males. Kegel exercises involve squeezing your pelvic floor muscles. These are the same muscles you squeeze when you try to stop the flow of urine or keep from passing gas. The exercises can be done while sitting, standing, or lying down, but it is best to vary your position. Ask your health care provider which exercises are safe for you. Do exercises exactly as told by your health care provider and adjust them as directed. Do not begin these exercises until told by your health care provider. Exercises How to do Kegel exercises: Squeeze your pelvic floor muscles tight. You should feel a tight lift in your rectal area. If you are a male, you should also feel a tightness in your vaginal area. Keep your stomach, buttocks, and legs relaxed. Hold the muscles tight for up to 10 seconds. Breathe normally. Relax your muscles for up to 10 seconds. Repeat as told by your health care provider. Repeat this exercise daily as told by your health care provider. Continue to do this exercise for at least 4-6 weeks, or for as long as told by your health care provider. You may be referred to a physical therapist who can help you learn more about how to do Kegel exercises. Depending on your condition, your health care provider may  recommend: Varying how long you squeeze your muscles. Doing several sets of exercises every day. Doing exercises for several weeks. Making Kegel exercises a part of your regular exercise routine. This information is not intended to replace advice given to you by your health care provider. Make sure you discuss any questions you have with your health care provider. Document Revised: 10/29/2020 Document Reviewed: 10/29/2020 Elsevier Patient Education  2023 Elsevier Inc.  

## 2021-11-04 DIAGNOSIS — R42 Dizziness and giddiness: Secondary | ICD-10-CM | POA: Diagnosis not present

## 2021-11-05 ENCOUNTER — Ambulatory Visit
Admission: EM | Admit: 2021-11-05 | Discharge: 2021-11-05 | Disposition: A | Discharge status: Home / Self Care | Payer: Medicare Other | Attending: Emergency Medicine | Admitting: Emergency Medicine

## 2021-11-05 DIAGNOSIS — H1131 Conjunctival hemorrhage, right eye: Secondary | ICD-10-CM | POA: Insufficient documentation

## 2021-11-05 DIAGNOSIS — S59901A Unspecified injury of right elbow, initial encounter: Secondary | ICD-10-CM | POA: Diagnosis not present

## 2021-11-05 DIAGNOSIS — M25521 Pain in right elbow: Secondary | ICD-10-CM | POA: Insufficient documentation

## 2021-11-05 DIAGNOSIS — R079 Chest pain, unspecified: Secondary | ICD-10-CM | POA: Insufficient documentation

## 2021-11-05 DIAGNOSIS — K05212 Aggressive periodontitis, localized, moderate: Secondary | ICD-10-CM | POA: Insufficient documentation

## 2021-11-05 DIAGNOSIS — J3489 Other specified disorders of nose and nasal sinuses: Secondary | ICD-10-CM | POA: Insufficient documentation

## 2021-11-05 DIAGNOSIS — R040 Epistaxis: Secondary | ICD-10-CM | POA: Insufficient documentation

## 2021-11-05 DIAGNOSIS — N452 Orchitis: Secondary | ICD-10-CM | POA: Insufficient documentation

## 2021-11-05 DIAGNOSIS — M6208 Separation of muscle (nontraumatic), other site: Secondary | ICD-10-CM | POA: Insufficient documentation

## 2021-11-05 DIAGNOSIS — K219 Gastro-esophageal reflux disease without esophagitis: Secondary | ICD-10-CM | POA: Insufficient documentation

## 2021-11-05 DIAGNOSIS — K0262 Dental caries on smooth surface penetrating into dentin: Secondary | ICD-10-CM | POA: Insufficient documentation
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
## 2021-11-05 NOTE — Discharge Instructions (Addendum)
I have written a prescription and will fax it to them for initial physical therapy.  Please follow-up with your primary care provider at the [facility] if this requires ongoing management ?

## 2021-11-05 NOTE — ED Triage Notes (Signed)
Patient is here for "recent fall on the 21st & seen at [facility] in [place]". Injuries to right knee, right elbow, right hip". "I don't need Xray's, I just need PT, & need a referral because I have Medicare". No response from [facility].  ?

## 2021-11-06 DIAGNOSIS — Z20822 Contact with and (suspected) exposure to covid-19: Secondary | ICD-10-CM | POA: Diagnosis not present

## 2021-11-08 DIAGNOSIS — M25621 Stiffness of right elbow, not elsewhere classified: Secondary | ICD-10-CM | POA: Diagnosis not present

## 2021-11-08 DIAGNOSIS — M25521 Pain in right elbow: Secondary | ICD-10-CM | POA: Diagnosis not present

## 2021-11-08 DIAGNOSIS — Z20822 Contact with and (suspected) exposure to covid-19: Secondary | ICD-10-CM | POA: Diagnosis not present

## 2021-11-09 DIAGNOSIS — J301 Allergic rhinitis due to pollen: Secondary | ICD-10-CM | POA: Diagnosis not present

## 2021-11-12 DIAGNOSIS — M25521 Pain in right elbow: Secondary | ICD-10-CM | POA: Diagnosis not present

## 2021-11-12 DIAGNOSIS — M25621 Stiffness of right elbow, not elsewhere classified: Secondary | ICD-10-CM | POA: Diagnosis not present

## 2021-11-16 DIAGNOSIS — M25621 Stiffness of right elbow, not elsewhere classified: Secondary | ICD-10-CM | POA: Diagnosis not present

## 2021-11-16 DIAGNOSIS — M25521 Pain in right elbow: Secondary | ICD-10-CM | POA: Diagnosis not present

## 2021-11-19 DIAGNOSIS — J301 Allergic rhinitis due to pollen: Secondary | ICD-10-CM | POA: Diagnosis not present

## 2021-11-19 DIAGNOSIS — M25521 Pain in right elbow: Secondary | ICD-10-CM | POA: Diagnosis not present

## 2021-11-19 DIAGNOSIS — M25621 Stiffness of right elbow, not elsewhere classified: Secondary | ICD-10-CM | POA: Diagnosis not present

## 2021-11-22 DIAGNOSIS — M25521 Pain in right elbow: Secondary | ICD-10-CM | POA: Diagnosis not present

## 2021-11-22 DIAGNOSIS — M25621 Stiffness of right elbow, not elsewhere classified: Secondary | ICD-10-CM | POA: Diagnosis not present

## 2021-11-26 DIAGNOSIS — M25621 Stiffness of right elbow, not elsewhere classified: Secondary | ICD-10-CM | POA: Diagnosis not present

## 2021-11-26 DIAGNOSIS — J301 Allergic rhinitis due to pollen: Secondary | ICD-10-CM | POA: Diagnosis not present

## 2021-11-26 DIAGNOSIS — M25521 Pain in right elbow: Secondary | ICD-10-CM | POA: Diagnosis not present

## 2021-12-01 DIAGNOSIS — M25621 Stiffness of right elbow, not elsewhere classified: Secondary | ICD-10-CM | POA: Diagnosis not present

## 2021-12-01 DIAGNOSIS — M25521 Pain in right elbow: Secondary | ICD-10-CM | POA: Diagnosis not present

## 2021-12-03 DIAGNOSIS — M25621 Stiffness of right elbow, not elsewhere classified: Secondary | ICD-10-CM | POA: Diagnosis not present

## 2021-12-03 DIAGNOSIS — M25521 Pain in right elbow: Secondary | ICD-10-CM | POA: Diagnosis not present

## 2021-12-03 DIAGNOSIS — J301 Allergic rhinitis due to pollen: Secondary | ICD-10-CM | POA: Diagnosis not present

## 2021-12-06 DIAGNOSIS — M25621 Stiffness of right elbow, not elsewhere classified: Secondary | ICD-10-CM | POA: Diagnosis not present

## 2021-12-06 DIAGNOSIS — M25521 Pain in right elbow: Secondary | ICD-10-CM | POA: Diagnosis not present

## 2021-12-10 DIAGNOSIS — J301 Allergic rhinitis due to pollen: Secondary | ICD-10-CM | POA: Diagnosis not present

## 2021-12-10 DIAGNOSIS — R2681 Unsteadiness on feet: Secondary | ICD-10-CM | POA: Diagnosis not present

## 2021-12-10 DIAGNOSIS — M25521 Pain in right elbow: Secondary | ICD-10-CM | POA: Diagnosis not present

## 2021-12-10 DIAGNOSIS — M25621 Stiffness of right elbow, not elsewhere classified: Secondary | ICD-10-CM | POA: Diagnosis not present

## 2021-12-13 DIAGNOSIS — M25621 Stiffness of right elbow, not elsewhere classified: Secondary | ICD-10-CM | POA: Diagnosis not present

## 2021-12-13 DIAGNOSIS — M25521 Pain in right elbow: Secondary | ICD-10-CM | POA: Diagnosis not present

## 2021-12-13 DIAGNOSIS — R2681 Unsteadiness on feet: Secondary | ICD-10-CM | POA: Diagnosis not present

## 2021-12-17 DIAGNOSIS — J301 Allergic rhinitis due to pollen: Secondary | ICD-10-CM | POA: Diagnosis not present

## 2021-12-17 DIAGNOSIS — M25621 Stiffness of right elbow, not elsewhere classified: Secondary | ICD-10-CM | POA: Diagnosis not present

## 2021-12-17 DIAGNOSIS — R2681 Unsteadiness on feet: Secondary | ICD-10-CM | POA: Diagnosis not present

## 2021-12-17 DIAGNOSIS — M25521 Pain in right elbow: Secondary | ICD-10-CM | POA: Diagnosis not present

## 2021-12-20 DIAGNOSIS — M25521 Pain in right elbow: Secondary | ICD-10-CM | POA: Diagnosis not present

## 2021-12-20 DIAGNOSIS — R2681 Unsteadiness on feet: Secondary | ICD-10-CM | POA: Diagnosis not present

## 2021-12-20 DIAGNOSIS — M25621 Stiffness of right elbow, not elsewhere classified: Secondary | ICD-10-CM | POA: Diagnosis not present

## 2021-12-24 DIAGNOSIS — J301 Allergic rhinitis due to pollen: Secondary | ICD-10-CM | POA: Diagnosis not present

## 2021-12-24 DIAGNOSIS — M25621 Stiffness of right elbow, not elsewhere classified: Secondary | ICD-10-CM | POA: Diagnosis not present

## 2021-12-24 DIAGNOSIS — M25521 Pain in right elbow: Secondary | ICD-10-CM | POA: Diagnosis not present

## 2021-12-24 DIAGNOSIS — R2681 Unsteadiness on feet: Secondary | ICD-10-CM | POA: Diagnosis not present

## 2021-12-27 DIAGNOSIS — M25621 Stiffness of right elbow, not elsewhere classified: Secondary | ICD-10-CM | POA: Diagnosis not present

## 2021-12-27 DIAGNOSIS — M25521 Pain in right elbow: Secondary | ICD-10-CM | POA: Diagnosis not present

## 2021-12-27 DIAGNOSIS — R2681 Unsteadiness on feet: Secondary | ICD-10-CM | POA: Diagnosis not present

## 2021-12-31 DIAGNOSIS — M25621 Stiffness of right elbow, not elsewhere classified: Secondary | ICD-10-CM | POA: Diagnosis not present

## 2021-12-31 DIAGNOSIS — R2681 Unsteadiness on feet: Secondary | ICD-10-CM | POA: Diagnosis not present

## 2021-12-31 DIAGNOSIS — M25521 Pain in right elbow: Secondary | ICD-10-CM | POA: Diagnosis not present

## 2022-01-07 DIAGNOSIS — J301 Allergic rhinitis due to pollen: Secondary | ICD-10-CM | POA: Diagnosis not present

## 2022-01-14 DIAGNOSIS — J301 Allergic rhinitis due to pollen: Secondary | ICD-10-CM | POA: Diagnosis not present

## 2022-01-19 DIAGNOSIS — J301 Allergic rhinitis due to pollen: Secondary | ICD-10-CM | POA: Diagnosis not present

## 2022-01-21 DIAGNOSIS — J301 Allergic rhinitis due to pollen: Secondary | ICD-10-CM | POA: Diagnosis not present

## 2022-01-28 DIAGNOSIS — J301 Allergic rhinitis due to pollen: Secondary | ICD-10-CM | POA: Diagnosis not present

## 2022-02-08 DIAGNOSIS — J301 Allergic rhinitis due to pollen: Secondary | ICD-10-CM | POA: Diagnosis not present

## 2022-02-15 DIAGNOSIS — J301 Allergic rhinitis due to pollen: Secondary | ICD-10-CM | POA: Diagnosis not present

## 2022-02-22 DIAGNOSIS — J301 Allergic rhinitis due to pollen: Secondary | ICD-10-CM | POA: Diagnosis not present

## 2022-03-01 DIAGNOSIS — J301 Allergic rhinitis due to pollen: Secondary | ICD-10-CM | POA: Diagnosis not present

## 2022-03-08 DIAGNOSIS — J301 Allergic rhinitis due to pollen: Secondary | ICD-10-CM | POA: Diagnosis not present

## 2022-03-15 DIAGNOSIS — J301 Allergic rhinitis due to pollen: Secondary | ICD-10-CM | POA: Diagnosis not present

## 2022-03-29 DIAGNOSIS — J301 Allergic rhinitis due to pollen: Secondary | ICD-10-CM | POA: Diagnosis not present

## 2022-04-01 ENCOUNTER — Telehealth: Payer: Self-pay | Admitting: Gastroenterology

## 2022-04-01 NOTE — Telephone Encounter (Signed)
Pts medical records for [id number] were sent out to [facility] on 04/01/2022

## 2022-04-08 DIAGNOSIS — J301 Allergic rhinitis due to pollen: Secondary | ICD-10-CM | POA: Diagnosis not present

## 2022-04-11 DIAGNOSIS — J301 Allergic rhinitis due to pollen: Secondary | ICD-10-CM | POA: Diagnosis not present

## 2022-04-29 DIAGNOSIS — J301 Allergic rhinitis due to pollen: Secondary | ICD-10-CM | POA: Diagnosis not present

## 2022-05-06 DIAGNOSIS — J301 Allergic rhinitis due to pollen: Secondary | ICD-10-CM | POA: Diagnosis not present

## 2022-05-20 DIAGNOSIS — J301 Allergic rhinitis due to pollen: Secondary | ICD-10-CM | POA: Diagnosis not present

## 2022-06-10 DIAGNOSIS — J301 Allergic rhinitis due to pollen: Secondary | ICD-10-CM | POA: Diagnosis not present

## 2022-06-17 DIAGNOSIS — J301 Allergic rhinitis due to pollen: Secondary | ICD-10-CM | POA: Diagnosis not present

## 2022-07-15 DIAGNOSIS — H532 Diplopia: Secondary | ICD-10-CM | POA: Diagnosis not present

## 2022-07-15 DIAGNOSIS — H02209 Unspecified lagophthalmos unspecified eye, unspecified eyelid: Secondary | ICD-10-CM | POA: Diagnosis not present

## 2022-07-15 DIAGNOSIS — H2513 Age-related nuclear cataract, bilateral: Secondary | ICD-10-CM | POA: Diagnosis not present

## 2022-07-15 DIAGNOSIS — H40033 Anatomical narrow angle, bilateral: Secondary | ICD-10-CM | POA: Diagnosis not present

## 2022-07-28 DIAGNOSIS — L720 Epidermal cyst: Secondary | ICD-10-CM | POA: Diagnosis not present

## 2022-08-25 DIAGNOSIS — L720 Epidermal cyst: Secondary | ICD-10-CM | POA: Diagnosis not present

## 2022-10-21 DIAGNOSIS — Z0000 Encounter for general adult medical examination without abnormal findings: Secondary | ICD-10-CM | POA: Diagnosis not present

## 2022-10-21 DIAGNOSIS — R079 Chest pain, unspecified: Secondary | ICD-10-CM | POA: Diagnosis not present

## 2022-10-21 DIAGNOSIS — I491 Atrial premature depolarization: Secondary | ICD-10-CM | POA: Diagnosis not present

## 2022-10-21 DIAGNOSIS — I10 Essential (primary) hypertension: Secondary | ICD-10-CM | POA: Diagnosis not present

## 2022-11-01 DIAGNOSIS — R079 Chest pain, unspecified: Secondary | ICD-10-CM | POA: Diagnosis not present

## 2022-11-08 ENCOUNTER — Ambulatory Visit: Payer: Medicare Other | Admitting: Urology

## 2022-11-15 ENCOUNTER — Ambulatory Visit (INDEPENDENT_AMBULATORY_CARE_PROVIDER_SITE_OTHER): Payer: Medicare Other | Admitting: Urology

## 2022-11-15 VITALS — BP 123/72 | HR 81 | Ht 67.0 in | Wt 169.0 lb

## 2022-11-15 DIAGNOSIS — N138 Other obstructive and reflux uropathy: Secondary | ICD-10-CM

## 2022-11-15 DIAGNOSIS — N401 Enlarged prostate with lower urinary tract symptoms: Secondary | ICD-10-CM

## 2022-11-15 LAB — BLADDER SCAN AMB NON-IMAGING

## 2022-11-15 NOTE — Progress Notes (Signed)
   11/15/2022 3:42 PM   Marcene Duos [birth year] [id number]  Reason for visit: Follow up BPH, gross hematuria, status post HOLEP, ED with IPP  HPI: 77 year old male with distant history of PVP who developed recurrent urinary symptoms as well as recurrent gross hematuria and ultimately opted for HOLEP.  He underwent an uncomplicated HOLEP on 07/09/2021 with removal of multiple small bladder stones and 88 g of benign prostate tissue.  He had an episode of epididymitis afterwards treated with antibiotics at the [place].  Overall, he is doing well.  He is urinating with a strong stream, no further episodes of gross hematuria, has some mild dribbling in the evenings, no significant stress incontinence.  PVR today normal at 37 mL  He has an inflatable penile prosthesis which continues to function normally  Encouraged to continue Kegel exercises Follow-up with urology as needed, return precautions were discussed including recurrent gross hematuria or recurrence of urinary symptoms  I spent 30 total minutes on the day of the encounter including pre-visit review of the medical record, face-to-face time with the patient, and post visit ordering of labs/imaging/tests.   Sondra Come, MD  [facility] [address] [place], [place] [address] [phone]

## 2022-11-15 NOTE — Patient Instructions (Signed)
Kegel Exercises  Kegel exercises can help strengthen your pelvic floor muscles. The pelvic floor is a group of muscles that support your rectum, small intestine, and bladder. In females, pelvic floor muscles also help support the uterus. These muscles help you control the flow of urine and stool (feces). Kegel exercises are painless and simple. They do not require any equipment. Your provider may suggest Kegel exercises to: Improve bladder and bowel control. Improve sexual response. Improve weak pelvic floor muscles after surgery to remove the uterus (hysterectomy) or after pregnancy, in females. Improve weak pelvic floor muscles after prostate gland removal or surgery, in males. Kegel exercises involve squeezing your pelvic floor muscles. These are the same muscles you squeeze when you try to stop the flow of urine or keep from passing gas. The exercises can be done while sitting, standing, or lying down, but it is best to vary your position. Ask your health care provider which exercises are safe for you. Do exercises exactly as told by your health care provider and adjust them as directed. Do not begin these exercises until told by your health care provider. Exercises How to do Kegel exercises: Squeeze your pelvic floor muscles tight. You should feel a tight lift in your rectal area. If you are a male, you should also feel a tightness in your vaginal area. Keep your stomach, buttocks, and legs relaxed. Hold the muscles tight for up to 10 seconds. Breathe normally. Relax your muscles for up to 10 seconds. Repeat as told by your health care provider. Repeat this exercise daily as told by your health care provider. Continue to do this exercise for at least 4-6 weeks, or for as long as told by your health care provider. You may be referred to a physical therapist who can help you learn more about how to do Kegel exercises. Depending on your condition, your health care provider may  recommend: Varying how long you squeeze your muscles. Doing several sets of exercises every day. Doing exercises for several weeks. Making Kegel exercises a part of your regular exercise routine. This information is not intended to replace advice given to you by your health care provider. Make sure you discuss any questions you have with your health care provider. Document Revised: 10/29/2020 Document Reviewed: 10/29/2020 Elsevier Patient Education  2023 Elsevier Inc.  

## 2022-12-27 ENCOUNTER — Other Ambulatory Visit
Admission: RE | Admit: 2022-12-27 | Discharge: 2022-12-27 | Disposition: A | Discharge status: Home / Self Care | Payer: Medicare Other | Source: Ambulatory Visit | Attending: Pulmonary Disease | Admitting: Pulmonary Disease

## 2022-12-27 ENCOUNTER — Ambulatory Visit (INDEPENDENT_AMBULATORY_CARE_PROVIDER_SITE_OTHER): Payer: Medicare Other | Admitting: Pulmonary Disease

## 2022-12-27 ENCOUNTER — Encounter: Payer: Self-pay | Admitting: Pulmonary Disease

## 2022-12-27 VITALS — BP 120/84 | HR 58 | Temp 98.1°F | Ht 67.0 in | Wt 174.8 lb

## 2022-12-27 DIAGNOSIS — R062 Wheezing: Secondary | ICD-10-CM | POA: Diagnosis not present

## 2022-12-27 DIAGNOSIS — J45909 Unspecified asthma, uncomplicated: Secondary | ICD-10-CM

## 2022-12-27 DIAGNOSIS — R911 Solitary pulmonary nodule: Secondary | ICD-10-CM | POA: Diagnosis not present

## 2022-12-27 LAB — CBC WITH DIFFERENTIAL/PLATELET
Abs Immature Granulocytes: 0.01 10*3/uL (ref 0.00–0.07)
Basophils Absolute: 0 10*3/uL (ref 0.0–0.1)
Basophils Relative: 1 %
Eosinophils Absolute: 0.2 10*3/uL (ref 0.0–0.5)
Eosinophils Relative: 3 %
HCT: 40.7 % (ref 39.0–52.0)
Hemoglobin: 13 g/dL (ref 13.0–17.0)
Immature Granulocytes: 0 %
Lymphocytes Relative: 19 %
Lymphs Abs: 1.1 10*3/uL (ref 0.7–4.0)
MCH: 29 pg (ref 26.0–34.0)
MCHC: 31.9 g/dL (ref 30.0–36.0)
MCV: 90.6 fL (ref 80.0–100.0)
Monocytes Absolute: 0.5 10*3/uL (ref 0.1–1.0)
Monocytes Relative: 8 %
Neutro Abs: 4.3 10*3/uL (ref 1.7–7.7)
Neutrophils Relative %: 69 %
Platelets: 172 10*3/uL (ref 150–400)
RBC: 4.49 MIL/uL (ref 4.22–5.81)
RDW: 14.7 % (ref 11.5–15.5)
WBC: 6.1 10*3/uL (ref 4.0–10.5)
nRBC: 0 % (ref 0.0–0.2)

## 2022-12-27 LAB — NITRIC OXIDE: Nitric Oxide: 25

## 2022-12-27 MED ORDER — ALBUTEROL SULFATE HFA 108 (90 BASE) MCG/ACT IN AERS
2.0000 | INHALATION_SPRAY | Freq: Four times a day (QID) | RESPIRATORY_TRACT | 2 refills | Status: AC | PRN
Start: 2022-12-27 — End: ?
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
## 2022-12-27 NOTE — Patient Instructions (Signed)
I think you have allergic asthma.  We are going to get some breathing tests and an blood test to tell us more about your asthma.  Have sent a prescription for an emergency inhaler called albuterol you can use this when you notice wheezing or shortness of breath up to 4 times a day.  Continue getting your CT scans of the chest through the [place] system.  They already have a baseline on the small little nodule in your lung and they can follow this at no cost to you.  We will see her in follow-up in 6 to 8 weeks time call sooner should any problems arise.

## 2022-12-30 LAB — ALLERGEN PANEL (27) + IGE
Alternaria Alternata IgE: <0.1 kU/L
Aspergillus Fumigatus IgE: <0.1 kU/L
Bahia Grass IgE: <0.1 kU/L
Bermuda Grass IgE: <0.1 kU/L
Cat Dander IgE: <0.1 kU/L
Cedar, Mountain IgE: <0.1 kU/L
Cladosporium Herbarum IgE: <0.1 kU/L
Cocklebur IgE: <0.1 kU/L
Cockroach, American IgE: <0.1 kU/L
Common Silver Birch IgE: <0.1 kU/L
D Farinae IgE: <0.1 kU/L
D Pteronyssinus IgE: <0.1 kU/L
Dog Dander IgE: <0.1 kU/L
Elm, American IgE: <0.1 kU/L
Hickory, White IgE: <0.1 kU/L
IgE (Immunoglobulin E), Serum: 23 IU/mL (ref 6–495)
Johnson Grass IgE: <0.1 kU/L
Kentucky Bluegrass IgE: <0.1 kU/L
Maple/Box Elder IgE: <0.1 kU/L
Mucor Racemosus IgE: <0.1 kU/L
Oak, White IgE: <0.1 kU/L
Penicillium Chrysogen IgE: <0.1 kU/L
Pigweed, Rough IgE: <0.1 kU/L
Plantain, English IgE: <0.1 kU/L
Ragweed, Short IgE: <0.1 kU/L
Setomelanomma Rostrat: <0.1 kU/L
Timothy Grass IgE: <0.1 kU/L
White Mulberry IgE: <0.1 kU/L

## 2023-01-20 DIAGNOSIS — I10 Essential (primary) hypertension: Secondary | ICD-10-CM | POA: Diagnosis not present

## 2023-01-20 DIAGNOSIS — I491 Atrial premature depolarization: Secondary | ICD-10-CM | POA: Diagnosis not present

## 2023-01-20 DIAGNOSIS — E7849 Other hyperlipidemia: Secondary | ICD-10-CM | POA: Diagnosis not present

## 2023-02-07 ENCOUNTER — Ambulatory Visit: Payer: Medicare Other | Attending: Pulmonary Disease

## 2023-02-21 ENCOUNTER — Encounter: Payer: Self-pay | Admitting: Pulmonary Disease

## 2023-02-21 ENCOUNTER — Ambulatory Visit: Payer: Medicare Other | Admitting: Pulmonary Disease

## 2023-02-21 VITALS — BP 138/78 | HR 48 | Temp 98.0°F | Ht 67.0 in | Wt 170.6 lb

## 2023-02-21 DIAGNOSIS — J45909 Unspecified asthma, uncomplicated: Secondary | ICD-10-CM | POA: Diagnosis not present

## 2023-02-21 DIAGNOSIS — R911 Solitary pulmonary nodule: Secondary | ICD-10-CM | POA: Diagnosis not present

## 2023-02-21 DIAGNOSIS — R0602 Shortness of breath: Secondary | ICD-10-CM

## 2023-02-21 DIAGNOSIS — R062 Wheezing: Secondary | ICD-10-CM

## 2023-02-21 LAB — NITRIC OXIDE: Nitric Oxide: 29

## 2023-02-21 MED ORDER — FLUTICASONE FUROATE-VILANTEROL 100-25 MCG/ACT IN AEPB: 1.0000 | INHALATION_SPRAY | Freq: Every day | RESPIRATORY_TRACT | 6 refills | Status: DC

## 2023-02-21 NOTE — Patient Instructions (Addendum)
We are getting a CT of the chest through to evaluate your lung nodule.  We are scheduling the breathing test again as you did have some wheezing noted today.  We are giving you a trial of an inhaler called Breo Ellipta this is 1 puff daily.  Make sure you rinse your mouth well after you use it.  We are going to see you in follow-up in 6 to 8 weeks time call sooner should any new problems arise.

## 2023-02-21 NOTE — Progress Notes (Signed)
Subjective:    Patient ID: Jeffery Vasquez, male    DOB: Jul 13, 1945, 77 y.o.   MRN: [id number]  Patient Care Team: Orene Desanctis, MD as PCP - General (Pediatrics) Corky Downs, MD (Internal Medicine) Mena Goes, MD as Referring Physician (Dermatology) Ihor Gully, MD (Inactive) as Consulting Physician (Urology)  Chief Complaint  Patient presents with   Follow-up    No SOB. Some wheezing. Cough with clear sputum for the last several days.     HPI [name] is a 77 year old lifelong never smoker who presents for evaluation of shortness of breath and wheezing which is more predominantly present at nighttime.  He was initially evaluated here on 27 December 2022.  Working diagnosis at that time was extrinsic asthma.  The patient was given albuterol inhaler to use as needed and PFTs were ordered.  He unfortunately missed the PFTs, these will have to be rescheduled.  He is also followed at the [facility] in [place].  The patient has only used the albuterol once since he was seen here initially.  He has not had cough or sputum production.  No hemoptysis.    He also has been noted to have a 4 mm nodule in the right middle lobe deemed benign by sequential CT chest performed with the [facility] medical system.  Those films are not available for review however, report of the most recent 1 was from September 30, 2022 and stated that the patient had a stable 4 mm nodule within the right middle lobe and an additional 2 to 3 mm nodule within the upper lobes a follow-up CT scan in a year was recommended.  The patient however is very concerned and does not feel that proper attention is being given to these nodules. Nodules were initially seen after an emergency room visit for an unrelated issue.  The patient has not had any fevers, chills or sweats.  No orthopnea or paroxysmal nocturnal dyspnea.  No lower extremity edema and no calf tenderness.   He has a history of primary spontaneous pneumothorax on  the right.  Pneumothorax was treated in June 2000 by Dr. Denyse Dago at [facility].  This was treated with chest tube placement.  This resolved without sequela.  Patient does not endorse any other symptomatology today.   Review of Systems A 10 point review of systems was performed and it is as noted above otherwise negative.   Patient Active Problem List   Diagnosis Date Noted   Aggressive periodontitis, localized, moderate 11/05/2021   Chest pain, unspecified 11/05/2021   Conjunctival hemorrhage, right eye 11/05/2021   Dental caries on smooth surface penetrating into dentin 11/05/2021   Epistaxis 11/05/2021   Orchitis 11/05/2021   Pain in right elbow 11/05/2021   Separation of muscle (nontraumatic), other site 11/05/2021   Other specified disorders of nose and nasal sinuses 11/05/2021   Gastroesophageal reflux disease 11/05/2021   Hx of colonic polyps    Polyp of descending colon    Seborrheic dermatitis, unspecified 09/11/2020   Hearing loss 09/11/2020   Osteoarthrosis 09/11/2020   Adenomatous polyp of colon 04/05/2019   Asymptomatic varicose veins 04/05/2019   Sensorineural hearing loss, bilateral 04/05/2019   Diverticulosis 04/05/2019   Diplopia 04/05/2019   Dermatochalasis 04/05/2019   History of kidney stones 04/05/2019   Essential hypertension, benign 09/20/2017   Full code status 04/25/2017   Encounter for immunization 04/25/2017   Organic erectile dysfunction 01/06/2017   Incontinence of feces    Right  inguinal hernia 08/16/2016   Postnasal drip 06/20/2016   Hyperlipidemia    Acid reflux    Abdominal hernia    Elevated PSA    Hematuria    FH ischemic heart disease    Odynophagia    History of malignant neoplasm of skin 09/09/2013   S/P repair of ventral hernia Dec 2014 06/25/2013   GERD (gastroesophageal reflux disease) 05/17/2013   Status post excision of acoustic neuroma-right 05/17/2013   Gross hematuria 04/09/2013   Benign prostatic  hyperplasia 04/09/2013   ED (erectile dysfunction) of organic origin 10/03/2012   Benign neoplasm of cranial nerves (HCC) 03/24/1992    Social History   Tobacco Use   Smoking status: Never    Passive exposure: Never   Smokeless tobacco: Never  Substance Use Topics   Alcohol use: No    Allergies  Allergen Reactions   Cephalexin Other (See Comments) and Diarrhea    SEVERE STOMACH AND ABDOMINAL ISSUES   Cephalosporins Other (See Comments)    SEVERE STOMACH AND ABDOMINAL ISSUES    Current Meds  Medication Sig   albuterol (VENTOLIN HFA) 108 (90 Base) MCG/ACT inhaler Inhale 2 puffs into the lungs every 6 (six) hours as needed.   cetirizine (ZYRTEC) 10 MG tablet Take 10 mg by mouth as needed for allergies.   fluocinonide (LIDEX) 0.05 % external solution APPLY SMALL AMOUNT TO AFFECTED AREA DAILY :  APPLY TO THE SCALP DAILY AS NEEDED FOR ITCHING AND SCALING   fluticasone furoate-vilanterol (BREO ELLIPTA) 100-25 MCG/ACT AEPB Inhale 1 puff into the lungs daily.   ketoconazole (NIZORAL) 2 % shampoo SHAMPOO AS DIRECTED AFFECTED AREA THREE TIMES A WEEK (LEAVE ON AFFECTED AREA FOR 10 MINUTES, THEN RINSE OFF WITH WATER)  APPLY TO THE SCALP THREE TIMES WEEKLY IN THE  SHOWER. LEAVE ON FOR 5 MINUTES TH   Multiple Vitamin (MULTIVITAMIN ADULT PO) Take 1 tablet by mouth daily.    Immunization History  Administered Date(s) Administered   Influenza, High Dose Seasonal PF 03/29/2012, 04/03/2013, 03/28/2014   Influenza-Unspecified 04/25/1995, 05/04/1997, 05/26/1997, 04/14/2000, 05/04/2000, 04/03/2001, 04/30/2001, 04/03/2002, 04/16/2002, 05/05/2003, 05/23/2003, 04/13/2004, 04/06/2005, 05/23/2005, 04/26/2006, 04/03/2008, 03/30/2011, 04/03/2020   Moderna Sars-Covid-2 Vaccination 07/16/2019, 08/13/2019, 09/30/2019, 05/05/2020   Pneumococcal Conjugate-13 07/04/2013, 05/13/2014   Pneumococcal Polysaccharide-23 11/16/2001, 03/30/2011, 07/05/2011   Pneumococcal-Unspecified 03/30/2011   Td 05/26/1997,  07/04/2013   Td (Adult),unspecified 05/26/1997   Tdap 09/27/2011, 06/10/2014   Zoster Recombinant(Shingrix) 09/15/2020, 11/10/2020        Objective:   BP 138/78 (BP Location: Left Arm, Cuff Size: Normal)   Pulse (!) 48   Temp 98 F (36.7 C)   Ht 5\' 7"  (1.702 m)   Wt 170 lb 9.6 oz (77.4 kg)   SpO2 98%   BMI 26.72 kg/m   SpO2: 98 % O2 Device: None (Room air)  GENERAL: Well-developed, well-nourished gentleman, no acute distress. HEAD: Normocephalic, atraumatic.  Right facial droop from prior acoustic neuroma resection. EYES: Pupils equal, round, reactive to light.  No scleral icterus.  MOUTH: Teeth intact, caps present, oral mucosa moist. NECK: Supple. No thyromegaly. Trachea midline. No JVD.  No adenopathy. PULMONARY: Good air entry bilaterally.  Mild end expiratory wheezing. CARDIOVASCULAR: S1 and S2. Regular rate and rhythm.  ABDOMEN: Benign. MUSCULOSKELETAL: No joint deformity, no clubbing, no edema.  NEUROLOGIC: Has right facial droop from prior acoustic neuroma surgery.  No other overt deficit noted SKIN: Intact,warm,dry. PSYCH: Gregarious, normal behavior.   Lab Results  Component Value Date   NITRICOXIDE 29 02/21/2023  Assessment & Plan:     ICD-10-CM   1. Extrinsic asthma without complication, unspecified asthma severity, unspecified whether persistent  J45.909    Will assess with PFTs Trial of Breo Ellipta 100, 1 inhalation daily As needed albuterol    2. Wheezing  R06.2 Nitric oxide   FeNO 29 today Likely due to poorly compensated asthma as above Trial of Breo as above    3. Lung nodule seen on imaging study  R91.1 CT CHEST WO CONTRAST   Establish new baseline CT chest without contrast      Orders Placed This Encounter  Procedures   CT CHEST WO CONTRAST    Standing Status:   Future    Standing Expiration Date:   02/21/2024    Order Specific Question:   Preferred imaging location?    Answer:   [facility]   Nitric oxide    Meds  ordered this encounter  Medications   fluticasone furoate-vilanterol (BREO ELLIPTA) 100-25 MCG/ACT AEPB    Sig: Inhale 1 puff into the lungs daily.    Dispense:  60 each    Refill:  6   Patient missed his prior PFT order, we will reschedule this.  Patient is also anxious with regards to his lung nodule Brien Few noted at the [place].  He is concerned he is not getting adequate follow-up.  We will recheck CT chest to establish baseline here.  That we are giving him a trial of Breo Ellipta 1 inhalation daily to see if this helps with his symptoms.  Will see him in follow-up in 6 to 8 weeks time he is to contact us prior to that time should any new difficulties arise.   Gailen Shelter, MD Advanced Bronchoscopy PCCM [facility]    *This note was dictated using voice recognition software/Dragon.  Despite best efforts to proofread, errors can occur which can change the meaning. Any transcriptional errors that result from this process are unintentional and may not be fully corrected at the time of dictation.

## 2023-02-28 ENCOUNTER — Ambulatory Visit
Admission: RE | Admit: 2023-02-28 | Discharge: 2023-02-28 | Disposition: A | Discharge status: Home / Self Care | Payer: Medicare Other | Source: Ambulatory Visit | Attending: Pulmonary Disease | Admitting: Pulmonary Disease

## 2023-02-28 DIAGNOSIS — I700 Atherosclerosis of aorta: Secondary | ICD-10-CM | POA: Diagnosis not present

## 2023-02-28 DIAGNOSIS — J479 Bronchiectasis, uncomplicated: Secondary | ICD-10-CM | POA: Diagnosis not present

## 2023-02-28 DIAGNOSIS — R911 Solitary pulmonary nodule: Secondary | ICD-10-CM | POA: Diagnosis not present

## 2023-02-28 DIAGNOSIS — Z77090 Contact with and (suspected) exposure to asbestos: Secondary | ICD-10-CM | POA: Insufficient documentation

## 2023-02-28 DIAGNOSIS — R918 Other nonspecific abnormal finding of lung field: Secondary | ICD-10-CM | POA: Diagnosis not present

## 2023-02-28 DIAGNOSIS — J45909 Unspecified asthma, uncomplicated: Secondary | ICD-10-CM | POA: Insufficient documentation

## 2023-02-28 DIAGNOSIS — I2510 Atherosclerotic heart disease of native coronary artery without angina pectoris: Secondary | ICD-10-CM | POA: Diagnosis not present

## 2023-02-28 DIAGNOSIS — K5730 Diverticulosis of large intestine without perforation or abscess without bleeding: Secondary | ICD-10-CM | POA: Diagnosis not present

## 2023-03-03 ENCOUNTER — Telehealth: Payer: Self-pay | Admitting: Pulmonary Disease

## 2023-03-03 NOTE — Telephone Encounter (Signed)
Patient came into the office 8/30 thinking he had an appointment to go over CT scan from 8/27. Informed patient of his scheduled appointments and that the CT has not been resulted yet. Patient would like a call to discuss CT scan results when they are resulted.

## 2023-03-07 NOTE — Telephone Encounter (Signed)
His CT shows nothing of overt concern.  There are some mild areas of inflammation.  He needs to keep taking the inhaler that I prescribed at his recent visit.  He does have asthma.

## 2023-03-07 NOTE — Telephone Encounter (Signed)
I have notified the patient. Nothing further needed. 

## 2023-03-28 ENCOUNTER — Ambulatory Visit: Payer: Medicare Other | Attending: Pulmonary Disease

## 2023-03-28 DIAGNOSIS — R0689 Other abnormalities of breathing: Secondary | ICD-10-CM | POA: Insufficient documentation

## 2023-03-28 DIAGNOSIS — R062 Wheezing: Secondary | ICD-10-CM | POA: Insufficient documentation

## 2023-03-28 LAB — PULMONARY FUNCTION TEST ARMC ONLY
DL/VA % pred: 58 %
DL/VA: 2.34 ml/min/mmHg/L
DLCO unc % pred: 66 %
DLCO unc: 15 ml/min/mmHg
FEF 25-75 Post: 2.6 L/sec
FEF 25-75 Pre: 2.58 L/sec
FEF2575-%Change-Post: 1 %
FEF2575-%Pred-Post: 144 %
FEF2575-%Pred-Pre: 143 %
FEV1-%Change-Post: 0 %
FEV1-%Pred-Post: 119 %
FEV1-%Pred-Pre: 119 %
FEV1-Post: 3.08 L
FEV1-Pre: 3.07 L
FEV1FVC-%Change-Post: 0 %
FEV1FVC-%Pred-Pre: 107 %
FEV6-%Change-Post: 0 %
FEV6-%Pred-Post: 117 %
FEV6-%Pred-Pre: 117 %
FEV6-Post: 3.95 L
FEV6-Pre: 3.95 L
FEV6FVC-%Change-Post: 0 %
FEV6FVC-%Pred-Post: 107 %
FEV6FVC-%Pred-Pre: 107 %
FVC-%Change-Post: 0 %
FVC-%Pred-Post: 109 %
FVC-%Pred-Pre: 109 %
FVC-Post: 3.97 L
FVC-Pre: 3.96 L
Post FEV1/FVC ratio: 78 %
Post FEV6/FVC ratio: 99 %
Pre FEV1/FVC ratio: 78 %
Pre FEV6/FVC Ratio: 100 %
RV % pred: 114 %
RV: 2.81 L
TLC % pred: 104 %
TLC: 6.76 L

## 2023-03-28 MED ORDER — ALBUTEROL SULFATE (2.5 MG/3ML) 0.083% IN NEBU
2.5000 mg | INHALATION_SOLUTION | Freq: Once | RESPIRATORY_TRACT | Status: AC
  Administered 2023-03-28: 2.5 mg via RESPIRATORY_TRACT
  Filled 2023-03-28: qty 3

## 2023-05-02 ENCOUNTER — Encounter: Payer: Self-pay | Admitting: Pulmonary Disease

## 2023-05-02 ENCOUNTER — Ambulatory Visit (INDEPENDENT_AMBULATORY_CARE_PROVIDER_SITE_OTHER): Payer: Medicare Other | Admitting: Pulmonary Disease

## 2023-05-02 VITALS — BP 128/84 | HR 75 | Temp 97.1°F | Ht 67.0 in | Wt 178.6 lb

## 2023-05-02 DIAGNOSIS — J479 Bronchiectasis, uncomplicated: Secondary | ICD-10-CM

## 2023-05-02 DIAGNOSIS — J4530 Mild persistent asthma, uncomplicated: Secondary | ICD-10-CM | POA: Diagnosis not present

## 2023-05-02 DIAGNOSIS — R911 Solitary pulmonary nodule: Secondary | ICD-10-CM | POA: Diagnosis not present

## 2023-05-02 DIAGNOSIS — R062 Wheezing: Secondary | ICD-10-CM

## 2023-05-02 LAB — NITRIC OXIDE: Nitric Oxide: 32

## 2023-05-02 MED ORDER — ARNUITY ELLIPTA 100 MCG/ACT IN AEPB: 1.0000 | INHALATION_SPRAY | Freq: Every day | RESPIRATORY_TRACT | 11 refills | Status: AC

## 2023-05-02 NOTE — Patient Instructions (Signed)
VISIT SUMMARY:  During today's visit, we discussed your ongoing issues with shortness of breath and wheezing, particularly at night. We reviewed your recent CT scan, which was normal, and talked about your history of mild persistent asthma and pulmonary nodules. We also discussed your decision to decline the flu shot due to past adverse reactions.  YOUR PLAN:  -MILD PERSISTENT ASTHMA: Mild persistent asthma is a condition where the airways in your lungs are inflamed and can cause symptoms like wheezing and shortness of breath. Since your previous inhaler did not help, we are starting you on Arnuity Ellipta as a maintenance therapy. Please continue to use your emergency inhaler as needed.  -PULMONARY NODULES: Pulmonary nodules are small growths in the lungs that are often benign and inflammatory. Your recent CT scan showed no concerning features. We will continue to monitor these with annual CT scans, and if the next scan is normal, no further scans will be needed.  -GENERAL HEALTH MAINTENANCE: You have declined the flu vaccination due to previous adverse reactions. We respect your decision and will continue to monitor your overall health.  INSTRUCTIONS:  Please follow up in 3 months to assess your response to the new inhaler therapy.

## 2023-05-02 NOTE — Progress Notes (Signed)
Subjective:    Patient ID: Jeffery Vasquez, male    DOB: 10/15/1945, 77 y.o.   MRN: [id number]  Patient Care Team: Orene Desanctis, MD as PCP - General (Pediatrics) Corky Downs, MD (Internal Medicine) Mena Goes, MD as Referring Physician (Dermatology) Ihor Gully, MD (Inactive) as Consulting Physician (Urology)  Chief Complaint  Patient presents with   Follow-up    No SOB or cough. Little wheezing.     BACKGROUND/INTERVAL:Jeffery Vasquez is a 77 year old lifelong never smoker who presents for follow-up of shortness of breath and wheezing which is more predominantly present at nighttime.  He also has a history of lung nodule noted previously.  He was seen on 28 February 2023.  PFTs and CT scan have been done in the interim.  Patient was given a trial of Breo during his visit in August which he did not find helpful.  He discontinued the medication.  HPI Discussed the use of AI scribe software for clinical note transcription with the patient, who gave verbal consent to proceed.  History of Present Illness   The patient, a lifelong non-smoker, has been experiencing shortness of breath and wheezing, predominantly at night. Despite being prescribed an inhaler, the patient reported no noticeable change in symptoms. The patient has been maintaining an active lifestyle, including walking three miles daily. The patient's recent CT scan was reported as having patchy regions of mild cylindrical bronchiectasis with mild bronchial wall thickening and minimal tree-in-bud opacity particularly in the right upper lobe.  Findings may represent indolent chronic atypical mycobacterial infection as other inflammatory disease. These findings were relayed to the patient. The patient has a history of mild persistent asthma and pulmonary nodules, which are suspected to be inflammatory in nature. The patient has only had to use his emergency inhaler once. The patient has declined the flu shot due to past adverse reactions.      Review of Systems A 10 point review of systems was performed and it is as noted above otherwise negative.   Patient Active Problem List   Diagnosis Date Noted   Aggressive periodontitis, localized, moderate 11/05/2021   Chest pain, unspecified 11/05/2021   Conjunctival hemorrhage, right eye 11/05/2021   Dental caries on smooth surface penetrating into dentin 11/05/2021   Epistaxis 11/05/2021   Orchitis 11/05/2021   Pain in right elbow 11/05/2021   Separation of muscle (nontraumatic), other site 11/05/2021   Other specified disorders of nose and nasal sinuses 11/05/2021   Gastroesophageal reflux disease 11/05/2021   Hx of colonic polyps    Polyp of descending colon    Seborrheic dermatitis, unspecified 09/11/2020   Hearing loss 09/11/2020   Osteoarthrosis 09/11/2020   Adenomatous polyp of colon 04/05/2019   Asymptomatic varicose veins 04/05/2019   Sensorineural hearing loss, bilateral 04/05/2019   Diverticulosis 04/05/2019   Diplopia 04/05/2019   Dermatochalasis 04/05/2019   History of kidney stones 04/05/2019   Essential hypertension, benign 09/20/2017   Full code status 04/25/2017   Encounter for immunization 04/25/2017   Organic erectile dysfunction 01/06/2017   Incontinence of feces    Right inguinal hernia 08/16/2016   Postnasal drip 06/20/2016   Hyperlipidemia    Acid reflux    Abdominal hernia    Elevated PSA    Hematuria    FH ischemic heart disease    Odynophagia    History of malignant neoplasm of skin 09/09/2013   S/P repair of ventral hernia Dec 2014 06/25/2013   GERD (gastroesophageal reflux disease) 05/17/2013   Status  post excision of acoustic neuroma-right 05/17/2013   Gross hematuria 04/09/2013   Benign prostatic hyperplasia 04/09/2013   ED (erectile dysfunction) of organic origin 10/03/2012   Benign neoplasm of cranial nerves (HCC) 03/24/1992    Social History   Tobacco Use   Smoking status: Never    Passive exposure: Never   Smokeless  tobacco: Never  Substance Use Topics   Alcohol use: No    Allergies  Allergen Reactions   Cephalexin Other (See Comments) and Diarrhea    SEVERE STOMACH AND ABDOMINAL ISSUES   Cephalosporins Other (See Comments)    SEVERE STOMACH AND ABDOMINAL ISSUES    Current Meds  Medication Sig   albuterol (VENTOLIN HFA) 108 (90 Base) MCG/ACT inhaler Inhale 2 puffs into the lungs every 6 (six) hours as needed.   cetirizine (ZYRTEC) 10 MG tablet Take 10 mg by mouth as needed for allergies.   fluocinonide (LIDEX) 0.05 % external solution APPLY SMALL AMOUNT TO AFFECTED AREA DAILY :  APPLY TO THE SCALP DAILY AS NEEDED FOR ITCHING AND SCALING   ketoconazole (NIZORAL) 2 % shampoo SHAMPOO AS DIRECTED AFFECTED AREA THREE TIMES A WEEK (LEAVE ON AFFECTED AREA FOR 10 MINUTES, THEN RINSE OFF WITH WATER)  APPLY TO THE SCALP THREE TIMES WEEKLY IN THE  SHOWER. LEAVE ON FOR 5 MINUTES TH   Multiple Vitamin (MULTIVITAMIN ADULT PO) Take 1 tablet by mouth daily.    Immunization History  Administered Date(s) Administered   Influenza, High Dose Seasonal PF 03/29/2012, 04/03/2013, 03/28/2014   Influenza-Unspecified 04/25/1995, 05/04/1997, 05/26/1997, 04/14/2000, 05/04/2000, 04/03/2001, 04/30/2001, 04/03/2002, 04/16/2002, 05/05/2003, 05/23/2003, 04/13/2004, 04/06/2005, 05/23/2005, 04/26/2006, 04/03/2008, 03/30/2011, 04/03/2020   Moderna Sars-Covid-2 Vaccination 07/16/2019, 08/13/2019, 09/30/2019, 05/05/2020   Pneumococcal Conjugate-13 07/04/2013, 05/13/2014   Pneumococcal Polysaccharide-23 11/16/2001, 03/30/2011, 07/05/2011   Pneumococcal-Unspecified 03/30/2011   Td 05/26/1997, 07/04/2013   Td (Adult),unspecified 05/26/1997   Tdap 09/27/2011, 06/10/2014   Zoster Recombinant(Shingrix) 09/15/2020, 11/10/2020        Objective:   BP 128/84 (BP Location: Right Arm, Cuff Size: Normal)   Pulse 75   Temp (!) 97.1 F (36.2 C)   Ht 5\' 7"  (1.702 m)   Wt 178 lb 9.6 oz (81 kg)   SpO2 95%   BMI 27.97 kg/m   SpO2: 95  % O2 Device: None (Room air)  GENERAL: Well-developed, well-nourished gentleman, no acute distress. HEAD: Normocephalic, atraumatic.  Right facial droop from prior acoustic neuroma resection. EYES: Pupils equal, round, reactive to light.  No scleral icterus.  MOUTH: Teeth intact, caps present, oral mucosa moist. NECK: Supple. No thyromegaly. Trachea midline. No JVD.  No adenopathy. PULMONARY: Good air entry bilaterally.  Mild end expiratory wheezing. CARDIOVASCULAR: S1 and S2. Regular rate and rhythm.  ABDOMEN: Benign. MUSCULOSKELETAL: No joint deformity, no clubbing, no edema.  NEUROLOGIC: Has right facial droop from prior acoustic neuroma surgery.  No other overt deficit noted SKIN: Intact,warm,dry. PSYCH: Gregarious, normal behavior.  Recent Results (from the past 2160 hour(s))  Nitric oxide     Status: None   Collection Time: 02/21/23 10:00 AM  Result Value Ref Range   Nitric Oxide 29   Pulmonary Function Test [facility] Only     Status: None   Collection Time: 03/28/23  7:57 AM  Result Value Ref Range   FVC-Pre 3.96 L   FVC-%Pred-Pre 109 %   FVC-Post 3.97 L   FVC-%Pred-Post 109 %   FVC-%Change-Post 0 %   FEV1-Pre 3.07 L   FEV1-%Pred-Pre 119 %   FEV1-Post 3.08 L   FEV1-%Pred-Post  119 %   FEV1-%Change-Post 0 %   FEV6-Pre 3.95 L   FEV6-%Pred-Pre 117 %   FEV6-Post 3.95 L   FEV6-%Pred-Post 117 %   FEV6-%Change-Post 0 %   Pre FEV1/FVC ratio 78 %   FEV1FVC-%Pred-Pre 107 %   Post FEV1/FVC ratio 78 %   FEV1FVC-%Change-Post 0 %   Pre FEV6/FVC Ratio 100 %   FEV6FVC-%Pred-Pre 107 %   Post FEV6/FVC ratio 99 %   FEV6FVC-%Pred-Post 107 %   FEV6FVC-%Change-Post 0 %   FEF 25-75 Pre 2.58 L/sec   FEF2575-%Pred-Pre 143 %   FEF 25-75 Post 2.60 L/sec   FEF2575-%Pred-Post 144 %   FEF2575-%Change-Post 1 %   RV 2.81 L   RV % pred 114 %   TLC 6.76 L   TLC % pred 104 %   DLCO unc 15.00 ml/min/mmHg   DLCO unc % pred 66 %   DL/VA 4.09 ml/min/mmHg/L   DL/VA % pred 58 %  Nitric oxide      Status: None   Collection Time: 05/02/23  8:47 AM  Result Value Ref Range   Nitric Oxide 32    Patient's PFTs were essentially normal.  DLCO invalid due to the patient unable to keep good seal on the mouthpiece due to mild facial droop (chronic, postsurgical).  Nitric oxide today 32 ppb intermediate for type II inflammation.   Assessment & Plan:     ICD-10-CM   1. Mild persistent asthma without complication  J45.30 Nitric oxide    2. Bronchiectasis without complication (HCC) - mild  J47.9     3. Lung nodule seen on imaging study - 4 mm GRANULOMA  R91.1      Orders Placed This Encounter  Procedures   Nitric oxide   Meds ordered this encounter  Medications   Fluticasone Furoate (ARNUITY ELLIPTA) 100 MCG/ACT AEPB    Sig: Inhale 1 puff into the lungs daily.    Dispense:  30 each    Refill:  11   Assessment and Plan    Mild Persistent Asthma Predominantly nocturnal symptoms of wheezing and shortness of breath. Previous inhaler therapy did not provide noticeable improvement. -Start Arnuity Ellipta as maintenance therapy. -Continue to use emergency inhaler as needed (only used once previously).  Pulmonary Nodule 4 mm granuloma, benign. Likely post inflammatory in nature. No concerning features on recent CT scan. -Continue expectant management with annual CT scans. If next scan is unremarkable, no further scans needed.  General Health Maintenance -Declined flu vaccination due to previous adverse reactions.  Follow-up in 3 months to assess response to new inhaler therapy.      Gailen Shelter, MD Advanced Bronchoscopy PCCM [facility]    *This note was dictated using voice recognition software/Dragon.  Despite best efforts to proofread, errors can occur which can change the meaning. Any transcriptional errors that result from this process are unintentional and may not be fully corrected at the time of dictation.

## 2023-05-08 DIAGNOSIS — E785 Hyperlipidemia, unspecified: Secondary | ICD-10-CM | POA: Diagnosis not present

## 2023-05-08 DIAGNOSIS — S199XXA Unspecified injury of neck, initial encounter: Secondary | ICD-10-CM | POA: Diagnosis not present

## 2023-05-08 DIAGNOSIS — M542 Cervicalgia: Secondary | ICD-10-CM | POA: Diagnosis not present

## 2023-05-08 DIAGNOSIS — I491 Atrial premature depolarization: Secondary | ICD-10-CM | POA: Diagnosis not present

## 2023-05-08 DIAGNOSIS — J45909 Unspecified asthma, uncomplicated: Secondary | ICD-10-CM | POA: Diagnosis not present

## 2023-05-08 DIAGNOSIS — Z87820 Personal history of traumatic brain injury: Secondary | ICD-10-CM | POA: Diagnosis not present

## 2023-05-08 DIAGNOSIS — S39012A Strain of muscle, fascia and tendon of lower back, initial encounter: Secondary | ICD-10-CM | POA: Diagnosis not present

## 2023-05-08 DIAGNOSIS — I10 Essential (primary) hypertension: Secondary | ICD-10-CM | POA: Diagnosis not present

## 2023-05-08 DIAGNOSIS — Z79899 Other long term (current) drug therapy: Secondary | ICD-10-CM | POA: Diagnosis not present

## 2023-05-08 DIAGNOSIS — M546 Pain in thoracic spine: Secondary | ICD-10-CM | POA: Diagnosis not present

## 2023-05-08 DIAGNOSIS — R519 Headache, unspecified: Secondary | ICD-10-CM | POA: Diagnosis not present

## 2023-05-08 DIAGNOSIS — N400 Enlarged prostate without lower urinary tract symptoms: Secondary | ICD-10-CM | POA: Diagnosis not present

## 2023-05-08 DIAGNOSIS — Z881 Allergy status to other antibiotic agents status: Secondary | ICD-10-CM | POA: Diagnosis not present

## 2023-05-08 DIAGNOSIS — M545 Low back pain, unspecified: Secondary | ICD-10-CM | POA: Diagnosis not present

## 2023-07-18 DIAGNOSIS — H40033 Anatomical narrow angle, bilateral: Secondary | ICD-10-CM | POA: Diagnosis not present

## 2023-07-18 DIAGNOSIS — H2513 Age-related nuclear cataract, bilateral: Secondary | ICD-10-CM | POA: Diagnosis not present

## 2023-07-18 DIAGNOSIS — H02202 Unspecified lagophthalmos right lower eyelid: Secondary | ICD-10-CM | POA: Diagnosis not present

## 2023-07-18 DIAGNOSIS — H532 Diplopia: Secondary | ICD-10-CM | POA: Diagnosis not present

## 2023-08-02 ENCOUNTER — Ambulatory Visit: Payer: Medicare Other | Admitting: Pulmonary Disease

## 2023-08-02 ENCOUNTER — Encounter: Payer: Self-pay | Admitting: Pulmonary Disease

## 2023-08-02 VITALS — BP 140/80 | HR 66 | Temp 98.0°F | Ht 67.0 in | Wt 177.4 lb

## 2023-08-02 DIAGNOSIS — J479 Bronchiectasis, uncomplicated: Secondary | ICD-10-CM

## 2023-08-02 DIAGNOSIS — J4530 Mild persistent asthma, uncomplicated: Secondary | ICD-10-CM

## 2023-08-02 DIAGNOSIS — R911 Solitary pulmonary nodule: Secondary | ICD-10-CM

## 2023-08-02 LAB — NITRIC OXIDE: Nitric Oxide: 25

## 2023-08-02 MED ORDER — TRELEGY ELLIPTA 100-62.5-25 MCG/ACT IN AEPB
1.0000 | INHALATION_SPRAY | Freq: Every day | RESPIRATORY_TRACT | Status: AC
Start: 2023-08-02 — End: ?

## 2023-08-02 NOTE — Progress Notes (Unsigned)
Subjective:    Patient ID: Jeffery Vasquez, male    DOB: 1946-01-16, 78 y.o.   MRN: [id number]  Patient Care Team: Orene Desanctis, MD as PCP - General (Pediatrics) Corky Downs, MD (Internal Medicine) Mena Goes, MD as Referring Physician (Dermatology) Ihor Gully, MD (Inactive) as Consulting Physician (Urology)  Chief Complaint  Patient presents with  . Follow-up    BACKGROUND/INTERVAL: Mr. Guarino is a 78 year old lifelong never smoker who presents for follow-up of shortness of breath and wheezing which is more predominantly present at nighttime.  He also has a history of lung nodule noted previously.  He was seen on 02 May 2023.  Patient was placed on Arnuity Ellipta previously, patient discontinued the medication due to cost.  Using as needed albuterol.  He is also being seen for a lung nodule that is a granuloma and does not require further imaging.  HPI Discussed the use of AI scribe software for clinical note transcription with the patient, who gave verbal consent to proceed.  History of Present Illness   The patient, with asthma, presents with increased mucus production and cough.  He has experienced increased mucus production over the past month, which he attributes to the cold weather. The mucus is clear and located in the bronchial area, accompanied by a coarse sensation in his airway. He has been coughing up the mucus but has no significant issues with breathing, describing it as 'not bad'.  He acknowledges having asthma and mentions that he has not been taking the prescribed Arnuity due to cost concerns but plans to obtain it through the [place].  Overall he has not had any chest pain.  No orthopnea or paroxysmal nocturnal dyspnea.  No lower extremity edema.  Does not endorse any significant dyspnea on exertion.  Overall he feels well and looks well.     DATA 02/28/2023 CT chest without contrast: Mild cylindrical bronchiectasis, no significant pulmonary nodules.Minimal  tree in bud opacity on RUL.Calcified 4 mm LUL granuloma. 03/28/2023 PFTs: FEV1 3.07 L or 119% predicted, FVC 3.96 L or 109% predicted, FEV1/FVC 78%, bronchodilator response.  Lung volumes are normal with very mild air trapping noted.  Diffusion capacity mildly reduced however patient had difficulty with this portion of the test due to inability to keep adequate seal on the mouthpiece.  Otherwise normal pulmonary function.  Review of Systems A 10 point review of systems was performed and it is as noted above otherwise negative.   Patient Active Problem List   Diagnosis Date Noted  . Aggressive periodontitis, localized, moderate 11/05/2021  . Chest pain, unspecified 11/05/2021  . Conjunctival hemorrhage, right eye 11/05/2021  . Dental caries on smooth surface penetrating into dentin 11/05/2021  . Epistaxis 11/05/2021  . Orchitis 11/05/2021  . Pain in right elbow 11/05/2021  . Separation of muscle (nontraumatic), other site 11/05/2021  . Other specified disorders of nose and nasal sinuses 11/05/2021  . Gastroesophageal reflux disease 11/05/2021  . Hx of colonic polyps   . Polyp of descending colon   . Seborrheic dermatitis, unspecified 09/11/2020  . Hearing loss 09/11/2020  . Osteoarthrosis 09/11/2020  . Adenomatous polyp of colon 04/05/2019  . Asymptomatic varicose veins 04/05/2019  . Sensorineural hearing loss, bilateral 04/05/2019  . Diverticulosis 04/05/2019  . Diplopia 04/05/2019  . Dermatochalasis 04/05/2019  . History of kidney stones 04/05/2019  . Essential hypertension, benign 09/20/2017  . Full code status 04/25/2017  . Encounter for immunization 04/25/2017  . Organic erectile dysfunction 01/06/2017  . Incontinence of  feces   . Right inguinal hernia 08/16/2016  . Postnasal drip 06/20/2016  . Hyperlipidemia   . Acid reflux   . Abdominal hernia   . Elevated PSA   . Hematuria   . FH ischemic heart disease   . Odynophagia   . History of malignant neoplasm of skin  09/09/2013  . S/P repair of ventral hernia Dec 2014 06/25/2013  . GERD (gastroesophageal reflux disease) 05/17/2013  . Status post excision of acoustic neuroma-right 05/17/2013  . Gross hematuria 04/09/2013  . Benign prostatic hyperplasia 04/09/2013  . ED (erectile dysfunction) of organic origin 10/03/2012  . Benign neoplasm of cranial nerves (HCC) 03/24/1992    Social History   Tobacco Use  . Smoking status: Never    Passive exposure: Never  . Smokeless tobacco: Never  Substance Use Topics  . Alcohol use: No    Allergies  Allergen Reactions  . Cephalexin Other (See Comments) and Diarrhea    SEVERE STOMACH AND ABDOMINAL ISSUES  . Cephalosporins Other (See Comments)    SEVERE STOMACH AND ABDOMINAL ISSUES    Current Meds  Medication Sig  . albuterol (VENTOLIN HFA) 108 (90 Base) MCG/ACT inhaler Inhale 2 puffs into the lungs every 6 (six) hours as needed.  . cetirizine (ZYRTEC) 10 MG tablet Take 10 mg by mouth as needed for allergies.  . fluocinonide (LIDEX) 0.05 % external solution APPLY SMALL AMOUNT TO AFFECTED AREA DAILY :  APPLY TO THE SCALP DAILY AS NEEDED FOR ITCHING AND SCALING  . Fluticasone Furoate (ARNUITY ELLIPTA) 100 MCG/ACT AEPB Inhale 1 puff into the lungs daily.  Marland Kitchen ketoconazole (NIZORAL) 2 % shampoo SHAMPOO AS DIRECTED AFFECTED AREA THREE TIMES A WEEK (LEAVE ON AFFECTED AREA FOR 10 MINUTES, THEN RINSE OFF WITH WATER)  APPLY TO THE SCALP THREE TIMES WEEKLY IN THE  SHOWER. LEAVE ON FOR 5 MINUTES TH  . Multiple Vitamin (MULTIVITAMIN ADULT PO) Take 1 tablet by mouth daily.    Immunization History  Administered Date(s) Administered  . Influenza, High Dose Seasonal PF 03/29/2012, 04/03/2013, 03/28/2014  . Influenza-Unspecified 04/25/1995, 05/04/1997, 05/26/1997, 04/14/2000, 05/04/2000, 04/03/2001, 04/30/2001, 04/03/2002, 04/16/2002, 05/05/2003, 05/23/2003, 04/13/2004, 04/06/2005, 05/23/2005, 04/26/2006, 04/03/2008, 03/30/2011, 04/03/2020  . Moderna Sars-Covid-2  Vaccination 07/16/2019, 08/13/2019, 09/30/2019, 05/05/2020  . Pneumococcal Conjugate-13 07/04/2013, 05/13/2014  . Pneumococcal Polysaccharide-23 11/16/2001, 03/30/2011, 07/05/2011  . Pneumococcal-Unspecified 03/30/2011  . Td 05/26/1997, 07/04/2013  . Td (Adult),unspecified 05/26/1997  . Tdap 09/27/2011, 06/10/2014  . Zoster Recombinant(Shingrix) 09/15/2020, 11/10/2020        Objective:   BP (!) 140/80 (BP Location: Right Arm, Patient Position: Sitting, Cuff Size: Normal)   Pulse 66   Temp 98 F (36.7 C) (Oral)   Ht 5\' 7"  (1.702 m)   Wt 177 lb 6.4 oz (80.5 kg)   SpO2 100%   BMI 27.78 kg/m   SpO2: 100 % O2 Device: None (Room air)  GENERAL: Well-developed, well-nourished gentleman, no acute distress. HEAD: Normocephalic, atraumatic.  Right facial droop from prior acoustic neuroma resection. EYES: Pupils equal, round, reactive to light.  No scleral icterus.  MOUTH: Teeth intact, caps present, oral mucosa moist. NECK: Supple. No thyromegaly. Trachea midline. No JVD.  No adenopathy. PULMONARY: Good air entry bilaterally.  Coarse, mild end expiratory wheezing. CARDIOVASCULAR: S1 and S2. Regular rate and rhythm.  ABDOMEN: Benign. MUSCULOSKELETAL: No joint deformity, no clubbing, no edema.  NEUROLOGIC: Has right facial droop from prior acoustic neuroma surgery.  No other overt deficit noted SKIN: Intact,warm,dry. PSYCH: Gregarious, normal behavior.  Assessment & Plan:   No diagnosis found.  No orders of the defined types were placed in this encounter.   No orders of the defined types were placed in this encounter.      Advised if symptoms do not improve or worsen, to please contact office for sooner follow up or seek emergency care.    I spent xxx minutes of dedicated to the care of this patient on the date of this encounter to include pre-visit review of records, face-to-face time with the patient discussing conditions above, post visit ordering of testing,  clinical documentation with the electronic health record, making appropriate referrals as documented, and communicating necessary findings to members of the patients care team.     C. Danice Goltz, MD Advanced Bronchoscopy P[facility]    *This note was generated using voice recognition software/Dragon and/or AI transcription program.  Despite best efforts to proofread, errors can occur which can change the meaning. Any transcriptional errors that result from this process are unintentional and may not be fully corrected at the time of dictation.

## 2023-08-02 NOTE — Patient Instructions (Signed)
VISIT SUMMARY:  During today's visit, we discussed your asthma symptoms, including increased mucus production and cough, likely due to the cold weather. We also reviewed your history of a collapsed right lung and a lung nodule.  YOUR PLAN:  -ASTHMA: Asthma is a condition where your airways become inflamed and produce extra mucus, which can make it difficult to breathe. You reported increased mucus production and coughing. We discussed starting you on a Trelegy inhaler, which you should use once daily and rinse your mouth afterward to prevent oral thrush. You plan to obtain your medication from the [place] due to cost concerns. We will check your airway inflammation level and follow up in six months.  -LUNG NODULE: A lung nodule is a small growth in the lung that is usually benign but needs monitoring. You have no new symptoms or changes, so we will continue to monitor for any changes or symptoms.  -PNEUMOTHORAX: A pneumothorax is a collapsed lung, which you experienced after heavy lifting. It was treated with a chest tube insertion. We will continue to monitor for any respiratory issues.  INSTRUCTIONS:  Please schedule a follow-up appointment in six months.

## 2023-08-02 NOTE — Progress Notes (Unsigned)
Patient seen in the office today and instructed on use of Trelegy.  Patient expressed understanding and demonstrated technique.

## 2023-08-14 ENCOUNTER — Encounter: Payer: Self-pay | Admitting: Pulmonary Disease

## 2024-01-26 DIAGNOSIS — I10 Essential (primary) hypertension: Secondary | ICD-10-CM | POA: Diagnosis not present

## 2024-01-26 DIAGNOSIS — I444 Left anterior fascicular block: Secondary | ICD-10-CM | POA: Diagnosis not present

## 2024-01-26 DIAGNOSIS — I491 Atrial premature depolarization: Secondary | ICD-10-CM | POA: Diagnosis not present

## 2024-01-26 DIAGNOSIS — R9431 Abnormal electrocardiogram [ECG] [EKG]: Secondary | ICD-10-CM | POA: Diagnosis not present

## 2024-01-26 DIAGNOSIS — E7849 Other hyperlipidemia: Secondary | ICD-10-CM | POA: Diagnosis not present
